# Patient Record
Sex: Male | Born: 1940 | Race: Black or African American | Hispanic: No | Smoking: Former smoker
Health system: Southern US, Community
[De-identification: ages and names within clinical notes are randomized; demographics above are authoritative.]

## PROBLEM LIST (undated history)

## (undated) DIAGNOSIS — M810 Age-related osteoporosis without current pathological fracture: Secondary | ICD-10-CM

## (undated) DIAGNOSIS — F419 Anxiety disorder, unspecified: Secondary | ICD-10-CM

## (undated) DIAGNOSIS — G4733 Obstructive sleep apnea (adult) (pediatric): Secondary | ICD-10-CM

## (undated) DIAGNOSIS — R002 Palpitations: Secondary | ICD-10-CM

## (undated) DIAGNOSIS — I10 Essential (primary) hypertension: Secondary | ICD-10-CM

## (undated) DIAGNOSIS — E213 Hyperparathyroidism, unspecified: Secondary | ICD-10-CM

## (undated) DIAGNOSIS — E119 Type 2 diabetes mellitus without complications: Secondary | ICD-10-CM

## (undated) HISTORY — PX: HERNIA REPAIR: SHX51

## (undated) HISTORY — DX: Age-related osteoporosis without current pathological fracture: M81.0

## (undated) HISTORY — DX: Obstructive sleep apnea (adult) (pediatric): G47.33

## (undated) HISTORY — DX: Essential (primary) hypertension: I10

## (undated) HISTORY — PX: BACK SURGERY: SHX140

## (undated) HISTORY — DX: Type 2 diabetes mellitus without complications: E11.9

---

## 2012-08-26 ENCOUNTER — Other Ambulatory Visit (HOSPITAL_COMMUNITY): Payer: Self-pay | Admitting: Nephrology

## 2012-09-03 ENCOUNTER — Encounter (HOSPITAL_COMMUNITY)
Admission: RE | Admit: 2012-09-03 | Discharge: 2012-09-03 | Disposition: A | Discharge status: Home / Self Care | Payer: Medicare Other | Source: Ambulatory Visit | Attending: Nephrology | Admitting: Nephrology

## 2012-09-03 MED ORDER — TECHNETIUM TC 99M SESTAMIBI GENERIC - CARDIOLITE
25.0000 | Freq: Once | INTRAVENOUS | Status: AC | PRN
  Administered 2012-09-03: 25 via INTRAVENOUS

## 2012-09-10 ENCOUNTER — Encounter (INDEPENDENT_AMBULATORY_CARE_PROVIDER_SITE_OTHER): Payer: Self-pay

## 2012-10-02 ENCOUNTER — Encounter (INDEPENDENT_AMBULATORY_CARE_PROVIDER_SITE_OTHER): Payer: Self-pay | Admitting: Surgery

## 2012-10-03 ENCOUNTER — Other Ambulatory Visit (INDEPENDENT_AMBULATORY_CARE_PROVIDER_SITE_OTHER): Payer: Self-pay

## 2012-10-03 ENCOUNTER — Encounter (INDEPENDENT_AMBULATORY_CARE_PROVIDER_SITE_OTHER): Payer: Self-pay | Admitting: Surgery

## 2012-10-03 ENCOUNTER — Ambulatory Visit (INDEPENDENT_AMBULATORY_CARE_PROVIDER_SITE_OTHER): Payer: Medicare Other | Admitting: Surgery

## 2012-10-03 VITALS — BP 142/80 | HR 96 | Temp 97.9°F | Resp 20 | Ht 70.5 in | Wt 274.6 lb

## 2012-10-03 DIAGNOSIS — E210 Primary hyperparathyroidism: Secondary | ICD-10-CM | POA: Insufficient documentation

## 2012-10-03 NOTE — Progress Notes (Signed)
General Surgery [facility]  Chief Complaint  Patient presents with  . 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 Zetta Bills    HISTORY: Patient is a 71 year old black male referred by his nephrologist for evaluation of suspected primary hyperparathyroidism. Patient has chronic renal insufficiency and is followed closely by his nephrologist. He was noted on routine laboratory studies to have an elevated calcium level of 11.2. Vitamin D levels were normal.  Intact PTH level was elevated at 114. Patient subsequently underwent a nuclear medicine parathyroid scan which appears to localize a parathyroid adenoma to the left inferior position in the upper mediastinum. CT scan of the neck and upper mediastinum has been recommended by radiology for further localization.  Patient has no prior history of head or neck surgery. He has no history of thyroid or parathyroid disease. He has never been on thyroid medication. He has had no complications from his hyperparathyroidism and denies nephrolithiasis, fractures, osteoporosis, or chronic fatigue.  Past Medical History  Diagnosis Date  . Hypertension   . Chronic kidney disease   . Diabetes mellitus without complication   . Sleep apnea, obstructive   . Osteoporosis      Current Outpatient Prescriptions  Medication Sig Dispense Refill  . amLODipine-valsartan (EXFORGE) 10-320 MG per tablet Take 1 tablet by mouth daily.      Marland Kitchen aspirin (ASPIRIN EC) 81 MG EC tablet Take 81 mg by mouth daily. Swallow whole.      . chlorthalidone (HYGROTON) 25 MG tablet Take 25 mg by mouth daily.      . Insulin Aspart (NOVOLOG FLEXPEN [place]) Inject into the skin.      Marland Kitchen insulin glargine (LANTUS) 100 UNIT/ML injection Inject 45 Units into the skin 2 (two) times daily.      . Multiple Vitamins-Minerals (MULTIVITAMIN WITH MINERALS) tablet Take 1 tablet by mouth daily.      . simvastatin (ZOCOR) 40 MG tablet Take 40 mg by mouth  every evening.      . terazosin (HYTRIN) 10 MG capsule Take 20 mg by mouth at bedtime.         No Known Allergies   Family History  Problem Relation Age of Onset  . Diabetes Mother   . Heart disease Mother   . Diabetes Father   . Heart disease Father      History   Social History  . Marital Status: Legally Separated    Spouse Name: N/A    Number of Children: N/A  . Years of Education: N/A   Social History Main Topics  . Smoking status: Former Games developer  . Smokeless tobacco: Former Neurosurgeon    Quit date: 11/13/1981  . Alcohol Use: No  . Drug Use: No  . Sexually Active: None   Other Topics Concern  . None   Social History Narrative  . None     REVIEW OF SYSTEMS - PERTINENT POSITIVES ONLY: Denies fatigue. Denies tremor. Denies palpitations. Denies nephrolithiasis. Denies bone or joint disease.  EXAM: Filed Vitals:   10/03/12 1417  BP: 142/80  Pulse: 96  Temp: 97.9 F (36.6 C)  Resp: 20    HEENT: normocephalic; pupils equal and reactive; sclerae clear; dentition fair; mucous membranes moist NECK:  symmetric on extension; no palpable anterior or posterior cervical lymphadenopathy; no supraclavicular masses; no tenderness CHEST: clear to auscultation bilaterally without rales, rhonchi, or wheezes CARDIAC: regular rate and rhythm without significant murmur; peripheral pulses are full EXT:  non-tender  with moderate edema; no deformity NEURO: no gross focal deficits; no sign of tremor   LABORATORY RESULTS: See [facility] (CHL-Epic) for most recent results   RADIOLOGY RESULTS: See [facility] (CHL-Epic) for most recent results   IMPRESSION: #1 Primary hyperparathyroidism, 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 We reviewed the laboratory studies provided by his nephrologist. We  reviewed the results of the radiographic studies. I provided him with written literature on parathyroid disease to review.  We will obtain a CT scan of the neck and upper mediastinum with contrast to better localize a possible parathyroid adenoma in the left inferior position. I will contact the patient with these results.  If the CT scan shows a mass consistent with parathyroid adenoma, then I think the patient will be an excellent candidate for minimally invasive surgery. This can be performed on an outpatient basis. We will discuss this once his CT scan results are available.  The risks and benefits of the procedure have been discussed at length with the patient.  The patient understands the proposed procedure, potential alternative treatments, and the course of recovery to be expected.  All of the patient's questions have been answered at this time.  The patient wishes to proceed with surgical evaluation.  Velora Heckler, MD, FACS General & Endocrine Surgery [facility]   Visit Diagnoses: 1. Hyperparathyroidism, primary     Primary Care Physician: Jolene Provost, MD

## 2012-10-03 NOTE — Patient Instructions (Signed)
Parathyroidectomy A parathyroidectomy is surgery to remove one or more parathyroid glands. These glands produce a hormone (parathyroid hormone) that helps control the level of calcium in your body. The glands are very small, about the size of a pea. They are located in your neck, close to your thyroid gland and your Adam's apple. Most people (85%) have four parathyroid glands,some people may have one or two more than that. Hyperparathyroidism is when too much parathyroid hormone is being produced. Usually this is caused by one of the parathyroid glands becoming enlarged, but it can also be caused by more than one of the glands. Hyperparathyroidism is found during blood tests that show high calcium in the blood. Parathyroid hormone levels will also be elevated. Cancer also can cause hyperparathyroidism, but this is rare. For the most common type of hyperparathyroidism, the treatment is surgical removal of the parathyroid gland that is enlarged. For patients with kidney failure and hyperparathyroidism, other treatment will be tried before surgery is done on the parathyroid.  Many times x-ray studies are done to find out which parathyroid gland or glands is malfunctioning. The decision about the best treatment for hyperparathyroidism is between the patient, their primary doctor, an endocrinologist, and a surgeon experienced in parathyroid surgery. LET YOUR CAREGIVER KNOW ABOUT:  Any allergies.  All medications you are taking, including:  Herbs, eyedrops, over-the-counter medications and creams.  Blood thinners (anticoagulants), aspirin or other drugs that could affect blood clotting.  Use of steroids (by mouth or as creams).  Previous problems with anesthetics, including local anesthetics.  Possibility of pregnancy, if this applies.  Any history of blood clots.  Any history of bleeding or other blood problems.  Previous surgery.  Smoking history.  Other health problems. RISKS AND  COMPLICATIONS   Short-term possibilities include:  Excessive bleeding.  Pain.  Infection near the incision.  Slow healing.  Pooling of blood under the wound (hematoma).  Damage to nerves in your neck.  Blood clots.  Difficulty breathing. This is very rare. It also is almost always temporary.  Longer-term possibilities include:  Scarring.  Skin damage.  Damage to blood vessels in the area.  Need for additional surgery.  A hoarse or weak voice. This is usually temporary. It can be the result of nerve damage.  Development of hypoparathyroidism. This means you are not making enough parathyroid hormone. It is rare. If it occurs, you will need to take calcium supplements daily. BEFORE THE PROCEDURE  Sometimes the surgery is done on an outpatient basis. This means you could go home the same day as your surgery. Other times, people need to stay in the hospital overnight. Ask your surgeon what you should expect.  If your surgery will be an outpatient procedure, arrange for someone to drive you home after the surgery.  Two weeks before your surgery, stop using aspirin and non-steroidal anti-inflammatory drugs (NSAID's) for pain relief. This includes prescription drugs and over-the-counter drugs such as ibuprofen and naproxen. Also stop taking vitamin E.  If you take blood-thinners, ask your healthcare provider when you should stop taking them.  Do not eat or drink for about 8 hours before your surgery.  You might be asked to shower or wash with a special antibacterial soap before the procedure.  Arrive at least an hour before the surgery, or whenever your surgeon recommends. This will give you time to check in and fill out any needed paperwork. PROCEDURE  The preparation:  You will change into a hospital gown.  You   will be given an IV. A needle will be inserted in your arm. Medication will be able to flow directly into your body through this needle.  You might be given a  sedative to help you relax.  You will be given a drug that puts you to sleep during the surgery (general anesthetic).  The procedure:  Once you are asleep, the surgeon will make a small cut (incision) in your lower neck. Ask your surgeon where the incision will be.  The surgeon will look for the gland(s) that are not working well. Often a tissue sample from a gland is used to determine this.  Any glands that are not working well will be removed.  The surgeon will close the incision with stitches, often these are hidden under the skin. AFTER THE PROCEDURE  You will stay in a recovery area until the anesthesia has worn off. Your blood pressure and heart rate will be checked.  If your surgery was an outpatient procedure, you will go home the same day.  If you need to stay in the hospital, you will be moved to a hospital room. You will probably stay for two to three days. This will depend on how quickly you recover.  While you are in the hospital, your blood will be tested to check the calcium levels in your body. HOME CARE INSTRUCTIONS   Take any medication that your surgeon prescribes. Follow the directions carefully. Take all of the medication.  Ask your surgeon whether you can take over-the-counter medicines for pain, discomfort or fever. Do not take aspirin unless your healthcare provider says to. Aspirin increases the chances of bleeding.  Do not get the wound wet for the first few days after surgery (or until the surgeon tells you it is OK). SEEK MEDICAL CARE IF:   You notice blood or fluid leaking from the wound, or it becomes red or swollen.  You have trouble breathing.  You have trouble speaking.  You become nauseous or throw up for more than two days after the surgery.  You develop a fever of more than 100.5 F (38.1 C). SEEK IMMEDIATE MEDICAL CARE IF:   Breathing becomes more difficult.  You develop a fever of 102.0 F (38.9 C) or higher. Document Released:  01/26/2009 Document Revised: 01/22/2012 Document Reviewed: 01/26/2009 ExitCare Patient Information 2013 [employer].  

## 2012-10-08 ENCOUNTER — Ambulatory Visit
Admission: RE | Admit: 2012-10-08 | Discharge: 2012-10-08 | Disposition: A | Discharge status: Home / Self Care | Payer: Medicare Other | Source: Ambulatory Visit | Attending: Surgery | Admitting: Surgery

## 2012-10-08 DIAGNOSIS — E210 Primary hyperparathyroidism: Secondary | ICD-10-CM

## 2012-10-08 MED ORDER — IOHEXOL 300 MG/ML  SOLN
75.0000 mL | Freq: Once | INTRAMUSCULAR | Status: AC | PRN
  Administered 2012-10-08: 75 mL via INTRAVENOUS

## 2012-10-09 ENCOUNTER — Telehealth (INDEPENDENT_AMBULATORY_CARE_PROVIDER_SITE_OTHER): Payer: Self-pay | Admitting: Surgery

## 2012-10-09 ENCOUNTER — Other Ambulatory Visit (INDEPENDENT_AMBULATORY_CARE_PROVIDER_SITE_OTHER): Payer: Self-pay | Admitting: Surgery

## 2012-10-09 DIAGNOSIS — E210 Primary hyperparathyroidism: Secondary | ICD-10-CM

## 2012-10-09 NOTE — Telephone Encounter (Signed)
General Surgery [facility]  CT scan results demonstrate two small nodular densities in the region where we would expect a parathyroid adenoma. This correlates with the result of the nuclear medicine parathyroid scan.  I contacted the patient and left a message that I recommend proceeding with minimally invasive parathyroid surgery. I believe we can do this as an outpatient.  Velora Heckler, MD, [facility] Office: [phone]

## 2012-11-12 ENCOUNTER — Encounter (HOSPITAL_COMMUNITY): Payer: Self-pay | Admitting: Pharmacy Technician

## 2012-11-14 ENCOUNTER — Encounter (HOSPITAL_COMMUNITY): Payer: Self-pay

## 2012-11-14 ENCOUNTER — Encounter (HOSPITAL_COMMUNITY)
Admission: RE | Admit: 2012-11-14 | Discharge: 2012-11-14 | Disposition: A | Discharge status: Home / Self Care | Payer: Medicare Other | Source: Ambulatory Visit | Attending: Surgery | Admitting: Surgery

## 2012-11-14 ENCOUNTER — Ambulatory Visit (HOSPITAL_COMMUNITY)
Admission: RE | Admit: 2012-11-14 | Discharge: 2012-11-14 | Disposition: A | Discharge status: Home / Self Care | Payer: Medicare Other | Source: Ambulatory Visit | Attending: Surgery | Admitting: Surgery

## 2012-11-14 DIAGNOSIS — Z01818 Encounter for other preprocedural examination: Secondary | ICD-10-CM | POA: Insufficient documentation

## 2012-11-14 HISTORY — DX: Anxiety disorder, unspecified: F41.9

## 2012-11-14 HISTORY — DX: Hyperparathyroidism, unspecified: E21.3

## 2012-11-14 LAB — CBC
HCT: 35.2 % — ABNORMAL LOW (ref 39.0–52.0)
MCH: 28.4 pg (ref 26.0–34.0)
MCV: 86.9 fL (ref 78.0–100.0)
Platelets: 222 10*3/uL (ref 150–400)
RDW: 14.1 % (ref 11.5–15.5)

## 2012-11-14 LAB — BASIC METABOLIC PANEL
BUN: 29 mg/dL — ABNORMAL HIGH (ref 6–23)
Calcium: 10.7 mg/dL — ABNORMAL HIGH (ref 8.4–10.5)
Creatinine, Ser: 1.44 mg/dL — ABNORMAL HIGH (ref 0.50–1.35)
GFR calc Af Amer: 55 mL/min — ABNORMAL LOW (ref >90)

## 2012-11-14 NOTE — Patient Instructions (Addendum)
[address] Xzavior ELIAZ FOUT  11/14/2012   Your procedure is scheduled on: 11/22/12  Report to [facility] at [phone].  Call this number if you have problems the morning of surgery 336-: [phone]   Remember: take half dose of insulin on the night before surgery 11/21/12 and no insulin on the morning of surgery 11/22/12   Do not eat food or drink liquids After Midnight.     Do not take any medicines the morning of surgery    Do not wear jewelry, make-up or nail polish.  Do not wear lotions, powders, or perfumes. You may wear deodorant.  Do not shave 48 hours prior to surgery. Men may shave face and neck.  Do not bring valuables to the hospital.  Contacts, dentures or bridgework may not be worn into surgery.  .   Patients discharged the day of surgery will not be allowed to drive home.  Name and phone number of your driver: Lenise Arena [phone] .   Please read over the following fact sheets that you were given: MRSA Information.  Birdie Sons, RN  pre op nurse call if needed [phone]    FAILURE TO FOLLOW THESE INSTRUCTIONS MAY RESULT IN CANCELLATION OF YOUR SURGERY   Patient Signature: ___________________________________________

## 2012-11-14 NOTE — Progress Notes (Signed)
Quick Note:  These results are acceptable for scheduled surgery.  Aspin Palomarez M. Gage Weant, MD, FACS [facility] Office: [phone]   ______ 

## 2012-11-22 ENCOUNTER — Encounter (HOSPITAL_COMMUNITY): Payer: Self-pay | Admitting: Anesthesiology

## 2012-11-22 ENCOUNTER — Ambulatory Visit (HOSPITAL_COMMUNITY)
Admission: RE | Admit: 2012-11-22 | Discharge: 2012-11-22 | Disposition: A | Discharge status: Home / Self Care | Payer: Medicare Other | Source: Ambulatory Visit | Attending: Surgery | Admitting: Surgery

## 2012-11-22 ENCOUNTER — Encounter (HOSPITAL_COMMUNITY)
Admission: RE | Disposition: A | Discharge status: Home / Self Care | Payer: Self-pay | Source: Ambulatory Visit | Attending: Surgery

## 2012-11-22 ENCOUNTER — Encounter (HOSPITAL_COMMUNITY): Payer: Self-pay | Admitting: Surgery

## 2012-11-22 ENCOUNTER — Ambulatory Visit (HOSPITAL_COMMUNITY): Payer: Medicare Other | Admitting: Anesthesiology

## 2012-11-22 ENCOUNTER — Encounter (HOSPITAL_COMMUNITY): Payer: Self-pay | Admitting: *Deleted

## 2012-11-22 DIAGNOSIS — E210 Primary hyperparathyroidism: Secondary | ICD-10-CM

## 2012-11-22 DIAGNOSIS — I129 Hypertensive chronic kidney disease with stage 1 through stage 4 chronic kidney disease, or unspecified chronic kidney disease: Secondary | ICD-10-CM | POA: Insufficient documentation

## 2012-11-22 DIAGNOSIS — Z79899 Other long term (current) drug therapy: Secondary | ICD-10-CM | POA: Insufficient documentation

## 2012-11-22 DIAGNOSIS — M810 Age-related osteoporosis without current pathological fracture: Secondary | ICD-10-CM | POA: Insufficient documentation

## 2012-11-22 DIAGNOSIS — Z7982 Long term (current) use of aspirin: Secondary | ICD-10-CM | POA: Insufficient documentation

## 2012-11-22 DIAGNOSIS — E119 Type 2 diabetes mellitus without complications: Secondary | ICD-10-CM | POA: Insufficient documentation

## 2012-11-22 DIAGNOSIS — G4733 Obstructive sleep apnea (adult) (pediatric): Secondary | ICD-10-CM | POA: Insufficient documentation

## 2012-11-22 DIAGNOSIS — N189 Chronic kidney disease, unspecified: Secondary | ICD-10-CM | POA: Insufficient documentation

## 2012-11-22 DIAGNOSIS — D351 Benign neoplasm of parathyroid gland: Secondary | ICD-10-CM

## 2012-11-22 DIAGNOSIS — Z794 Long term (current) use of insulin: Secondary | ICD-10-CM | POA: Insufficient documentation

## 2012-11-22 HISTORY — PX: PARATHYROIDECTOMY: SHX19

## 2012-11-22 LAB — GLUCOSE, CAPILLARY: Glucose-Capillary: 174 mg/dL — ABNORMAL HIGH (ref 70–99)

## 2012-11-22 SURGERY — PARATHYROIDECTOMY
Anesthesia: General | Site: Neck | Wound class: Clean

## 2012-11-22 MED ORDER — FENTANYL CITRATE 0.05 MG/ML IJ SOLN
INTRAMUSCULAR | Status: DC | PRN
  Administered 2012-11-22: 50 ug via INTRAVENOUS
  Administered 2012-11-22: 100 ug via INTRAVENOUS

## 2012-11-22 MED ORDER — ACETAMINOPHEN 10 MG/ML IV SOLN
INTRAVENOUS | Status: DC | PRN
  Administered 2012-11-22: 1000 mg via INTRAVENOUS

## 2012-11-22 MED ORDER — HYDROMORPHONE HCL PF 1 MG/ML IJ SOLN: 0.2500 mg | INTRAMUSCULAR | Status: DC | PRN

## 2012-11-22 MED ORDER — GLYCOPYRROLATE 0.2 MG/ML IJ SOLN
INTRAMUSCULAR | Status: DC | PRN
  Administered 2012-11-22: .8 mg via INTRAVENOUS

## 2012-11-22 MED ORDER — PHENYLEPHRINE HCL 10 MG/ML IJ SOLN
INTRAMUSCULAR | Status: DC | PRN
  Administered 2012-11-22 (×2): 40 ug via INTRAVENOUS

## 2012-11-22 MED ORDER — CEFAZOLIN SODIUM-DEXTROSE 2-3 GM-% IV SOLR
INTRAVENOUS | Status: AC
  Filled 2012-11-22: qty 50

## 2012-11-22 MED ORDER — NEOSTIGMINE METHYLSULFATE 1 MG/ML IJ SOLN
INTRAMUSCULAR | Status: DC | PRN
  Administered 2012-11-22: 5 mg via INTRAVENOUS

## 2012-11-22 MED ORDER — LIDOCAINE HCL (CARDIAC) 20 MG/ML IV SOLN
INTRAVENOUS | Status: DC | PRN
  Administered 2012-11-22: 50 mg via INTRAVENOUS

## 2012-11-22 MED ORDER — CEFAZOLIN SODIUM 1-5 GM-% IV SOLN
INTRAVENOUS | Status: AC
  Filled 2012-11-22: qty 50

## 2012-11-22 MED ORDER — SUCCINYLCHOLINE CHLORIDE 20 MG/ML IJ SOLN
INTRAMUSCULAR | Status: DC | PRN
  Administered 2012-11-22: 100 mg via INTRAVENOUS

## 2012-11-22 MED ORDER — PROPOFOL 10 MG/ML IV BOLUS
INTRAVENOUS | Status: DC | PRN
  Administered 2012-11-22: 200 mg via INTRAVENOUS

## 2012-11-22 MED ORDER — PROMETHAZINE HCL 25 MG/ML IJ SOLN: 6.2500 mg | INTRAMUSCULAR | Status: DC | PRN

## 2012-11-22 MED ORDER — LACTATED RINGERS IV SOLN: INTRAVENOUS | Status: DC

## 2012-11-22 MED ORDER — LACTATED RINGERS IV SOLN
INTRAVENOUS | Status: DC | PRN
  Administered 2012-11-22 (×2): via INTRAVENOUS

## 2012-11-22 MED ORDER — LIDOCAINE HCL 4 % MT SOLN
OROMUCOSAL | Status: DC | PRN
  Administered 2012-11-22: 4 mL via TOPICAL

## 2012-11-22 MED ORDER — HYDROCODONE-ACETAMINOPHEN 5-325 MG PO TABS: 1.0000 | ORAL_TABLET | ORAL | Status: DC | PRN

## 2012-11-22 MED ORDER — ROCURONIUM BROMIDE 100 MG/10ML IV SOLN
INTRAVENOUS | Status: DC | PRN
  Administered 2012-11-22: 40 mg via INTRAVENOUS

## 2012-11-22 MED ORDER — ONDANSETRON HCL 4 MG/2ML IJ SOLN
INTRAMUSCULAR | Status: DC | PRN
  Administered 2012-11-22: 4 mg via INTRAVENOUS

## 2012-11-22 MED ORDER — EPHEDRINE SULFATE 50 MG/ML IJ SOLN
INTRAMUSCULAR | Status: DC | PRN
  Administered 2012-11-22: 10 mg via INTRAVENOUS

## 2012-11-22 MED ORDER — DEXTROSE 5 % IV SOLN
3.0000 g | INTRAVENOUS | Status: AC
  Administered 2012-11-22: 3 g via INTRAVENOUS
  Filled 2012-11-22: qty 3000

## 2012-11-22 MED ORDER — ACETAMINOPHEN 10 MG/ML IV SOLN
INTRAVENOUS | Status: AC
  Filled 2012-11-22: qty 100

## 2012-11-22 MED ORDER — BUPIVACAINE HCL (PF) 0.25 % IJ SOLN
INTRAMUSCULAR | Status: AC
  Filled 2012-11-22: qty 30

## 2012-11-22 MED ORDER — AMLODIPINE BESYLATE 10 MG PO TABS
10.0000 mg | ORAL_TABLET | Freq: Once | ORAL | Status: AC
  Administered 2012-11-22: 10 mg via ORAL
  Filled 2012-11-22: qty 1

## 2012-11-22 MED ORDER — BUPIVACAINE HCL 0.25 % IJ SOLN
INTRAMUSCULAR | Status: DC | PRN
  Administered 2012-11-22: 10 mL

## 2012-11-22 SURGICAL SUPPLY — 41 items
ATTRACTOMAT 16X20 MAGNETIC DRP (DRAPES) ×2 IMPLANT
BENZOIN TINCTURE PRP APPL 2/3 (GAUZE/BANDAGES/DRESSINGS) ×2 IMPLANT
BLADE HEX COATED 2.75 (ELECTRODE) ×2 IMPLANT
BLADE SURG 15 STRL LF DISP TIS (BLADE) ×1 IMPLANT
BLADE SURG 15 STRL SS (BLADE) ×1
BLADE SURG ROTATE 9660 (MISCELLANEOUS) ×2 IMPLANT
CANISTER SUCTION 2500CC (MISCELLANEOUS) ×2 IMPLANT
CHLORAPREP W/TINT 10.5 ML (MISCELLANEOUS) ×2 IMPLANT
CLIP TI MEDIUM 6 (CLIP) ×2 IMPLANT
CLIP TI WIDE RED SMALL 6 (CLIP) ×2 IMPLANT
CLOTH BEACON ORANGE TIMEOUT ST (SAFETY) ×2 IMPLANT
CLSR STERI-STRIP ANTIMIC 1/2X4 (GAUZE/BANDAGES/DRESSINGS) ×2 IMPLANT
DISSECTOR ROUND CHERRY 3/8 STR (MISCELLANEOUS) ×2 IMPLANT
DRAPE PED LAPAROTOMY (DRAPES) ×2 IMPLANT
DRESSING SURGICEL FIBRLLR 1X2 (HEMOSTASIS) ×2 IMPLANT
DRSG SURGICEL FIBRILLAR 1X2 (HEMOSTASIS) ×4
ELECT REM PT RETURN 9FT ADLT (ELECTROSURGICAL) ×2
ELECTRODE REM PT RTRN 9FT ADLT (ELECTROSURGICAL) ×1 IMPLANT
GAUZE SPONGE 4X4 16PLY XRAY LF (GAUZE/BANDAGES/DRESSINGS) ×2 IMPLANT
GLOVE SURG ORTHO 8.0 STRL STRW (GLOVE) ×2 IMPLANT
GOWN STRL NON-REIN LRG LVL3 (GOWN DISPOSABLE) ×2 IMPLANT
GOWN STRL REIN XL XLG (GOWN DISPOSABLE) ×4 IMPLANT
KIT BASIN OR (CUSTOM PROCEDURE TRAY) ×2 IMPLANT
NEEDLE HYPO 25X1 1.5 SAFETY (NEEDLE) ×2 IMPLANT
NS IRRIG 1000ML POUR BTL (IV SOLUTION) ×2 IMPLANT
PACK BASIC VI WITH GOWN DISP (CUSTOM PROCEDURE TRAY) ×2 IMPLANT
PENCIL BUTTON HOLSTER BLD 10FT (ELECTRODE) ×2 IMPLANT
SPONGE GAUZE 4X4 12PLY (GAUZE/BANDAGES/DRESSINGS) IMPLANT
STAPLER VISISTAT 35W (STAPLE) ×2 IMPLANT
STRIP CLOSURE SKIN 1/2X4 (GAUZE/BANDAGES/DRESSINGS) ×2 IMPLANT
SUT MNCRL AB 4-0 PS2 18 (SUTURE) ×2 IMPLANT
SUT SILK 2 0 (SUTURE) ×1
SUT SILK 2-0 18XBRD TIE 12 (SUTURE) ×1 IMPLANT
SUT SILK 3 0 (SUTURE)
SUT SILK 3-0 18XBRD TIE 12 (SUTURE) IMPLANT
SUT VIC AB 3-0 SH 18 (SUTURE) ×2 IMPLANT
SYR BULB IRRIGATION 50ML (SYRINGE) ×2 IMPLANT
SYR CONTROL 10ML LL (SYRINGE) ×2 IMPLANT
TAPE CLOTH SURG 4X10 WHT LF (GAUZE/BANDAGES/DRESSINGS) ×2 IMPLANT
TOWEL OR 17X26 10 PK STRL BLUE (TOWEL DISPOSABLE) ×2 IMPLANT
YANKAUER SUCT BULB TIP 10FT TU (MISCELLANEOUS) ×2 IMPLANT

## 2012-11-22 NOTE — Anesthesia Postprocedure Evaluation (Signed)
Anesthesia Post Note  Patient: [name] Wiley  Procedure(s) Performed: Procedure(s) (LRB): PARATHYROIDECTOMY (N/A)  Anesthesia type: General  Patient location: PACU  Post pain: Pain level controlled  Post assessment: Post-op Vital signs reviewed  Last Vitals:  Filed Vitals:   11/22/12 0845  BP: 140/90  Pulse: 93  Temp:   Resp: 9    Post vital signs: Reviewed  Level of consciousness: sedated  Complications: No apparent anesthesia complications

## 2012-11-22 NOTE — Transfer of Care (Signed)
Immediate Anesthesia Transfer of Care Note  Patient: [name] Wiley  Procedure(s) Performed: Procedure(s) (LRB) with comments: PARATHYROIDECTOMY (N/A) - Parathyroidectomy  Patient Location: PACU  Anesthesia Type:General  Level of Consciousness: awake and alert   Airway & Oxygen Therapy: Patient Spontanous Breathing and Patient connected to face mask oxygen  Post-op Assessment: Report given to PACU RN and Post -op Vital signs reviewed and stable  Post vital signs: Reviewed and stable  Complications: No apparent anesthesia complications

## 2012-11-22 NOTE — Progress Notes (Signed)
Pt drinking and swallowing without difficulty.

## 2012-11-22 NOTE — Anesthesia Preprocedure Evaluation (Addendum)
Anesthesia Evaluation  Patient identified by MRN, date of birth, ID band Patient awake    Reviewed: Allergy & Precautions, H&P , NPO status , Patient's Chart, lab work & pertinent test results  Airway Mallampati: II TM Distance: >3 FB Neck ROM: Full    Dental  (+) Edentulous Upper and Edentulous Lower   Pulmonary sleep apnea , former smoker,  breath sounds clear to auscultation  Pulmonary exam normal       Cardiovascular hypertension, Pt. on medications Rhythm:Regular Rate:Normal     Neuro/Psych Anxiety negative neurological ROS     GI/Hepatic negative GI ROS, Neg liver ROS,   Endo/Other  diabetes, Poorly Controlled, Type 2, Insulin DependentMorbid obesity  Renal/GU negative Renal ROS  negative genitourinary   Musculoskeletal negative musculoskeletal ROS (+)   Abdominal (+) + obese,   Peds  Hematology negative hematology ROS (+)   Anesthesia Other Findings   Reproductive/Obstetrics negative OB ROS                          Anesthesia Physical Anesthesia Plan  ASA: III  Anesthesia Plan: General   Post-op Pain Management:    Induction: Intravenous  Airway Management Planned: Oral ETT  Additional Equipment:   Intra-op Plan:   Post-operative Plan: Extubation in OR  Informed Consent: I have reviewed the patients History and Physical, chart, labs and discussed the procedure including the risks, benefits and alternatives for the proposed anesthesia with the patient or authorized representative who has indicated his/her understanding and acceptance.   Dental advisory given  Plan Discussed with: CRNA  Anesthesia Plan Comments:         Anesthesia Quick Evaluation

## 2012-11-22 NOTE — H&P (Signed)
[name] Wiley is an 72 y.o. male.    Chief Complaint: primary hyperparathyroidism, left inferior parathyroid adenoma  HPI: Patient is a 72 year old black male referred by his nephrologist for evaluation of suspected primary hyperparathyroidism. Patient has chronic renal insufficiency and is followed closely by his nephrologist. He was noted on routine laboratory studies to have an elevated calcium level of 11.2. Vitamin D levels were normal. Intact PTH level was elevated at 114. Patient subsequently underwent a nuclear medicine parathyroid scan which appears to localize a parathyroid adenoma to the left inferior position in the upper mediastinum. CT scan of the neck and upper mediastinum has confirmed a mass at that location.   Patient has no prior history of head or neck surgery. He has no history of thyroid or parathyroid disease. He has never been on thyroid medication. He has had no complications from his hyperparathyroidism and denies nephrolithiasis, fractures, osteoporosis, or chronic fatigue.   Past Medical History  Diagnosis Date  . Hypertension   . Diabetes mellitus without complication   . Sleep apnea, obstructive   . Osteoporosis   . Hyperparathyroidism   . Anxiety     Past Surgical History  Procedure Date  . Hernia repair     Lt ing hernia  . Back surgery     lower back    Family History  Problem Relation Age of Onset  . Diabetes Mother   . Heart disease Mother   . Diabetes Father   . Heart disease Father    Social History:  reports that he quit smoking about 40 years ago. His smoking use included Cigarettes. He has a 20 pack-year smoking history. He has never used smokeless tobacco. He reports that he does not drink alcohol or use illicit drugs.  Allergies: No Known Allergies  Medications Prior to Admission  Medication Sig Dispense Refill  . amLODipine-valsartan (EXFORGE) 10-320 MG per tablet Take 1 tablet by mouth daily before breakfast.       . aspirin EC  81 MG tablet Take 162 mg by mouth daily.      . chlorthalidone (HYGROTON) 25 MG tablet Take 25 mg by mouth daily before breakfast.       . furosemide (LASIX) 40 MG tablet Take 40 mg by mouth daily before breakfast.      . insulin aspart (NOVOLOG) 100 UNIT/ML injection Inject 11-38 Units into the skin 3 (three) times daily before meals. Morning 70-90=16 units lunch 70-90=11units dinner 70-90=13 units if greater than 450 morning 42 units lunch 34 units dinner 38 units      . insulin glargine (LANTUS) 100 UNIT/ML injection Inject 45 Units into the skin 2 (two) times daily.      Marland Kitchen L-Methylfolate-B6-B12 (METANX PO) Take 40 mg by mouth 2 (two) times daily.      . Multiple Vitamin (MULTIVITAMIN) LIQD Take 5 mLs by mouth daily.      . simvastatin (ZOCOR) 40 MG tablet Take 40 mg by mouth at bedtime.       Marland Kitchen terazosin (HYTRIN) 10 MG capsule Take 20 mg by mouth at bedtime.        Results for orders placed during the hospital encounter of 11/22/12 (from the past 48 hour(s))  GLUCOSE, CAPILLARY     Status: Abnormal   Collection Time   11/22/12  6:21 AM      Component Value Range Comment   Glucose-Capillary 174 (*) 70 - 99 mg/dL    No results found.  Review of Systems  Constitutional: Negative.   HENT: Negative.   Eyes: Negative.   Respiratory: Negative.   Cardiovascular: Negative.   Gastrointestinal: Negative.   Genitourinary: Negative.   Musculoskeletal: Negative.   Skin: Negative.   Neurological: Negative.   Endo/Heme/Allergies: Negative.   Psychiatric/Behavioral: Negative.     Blood pressure 142/84, pulse 103, temperature 98 F (36.7 C), temperature source Oral, resp. rate 16, SpO2 98.00%. Physical Exam  Constitutional: He is oriented to person, place, and time. He appears well-developed and well-nourished. No distress.  HENT:  Head: Normocephalic and atraumatic.  Right Ear: External ear normal.  Left Ear: External ear normal.  Mouth/Throat: Oropharynx is clear and moist.  Eyes:  Conjunctivae normal are normal. Pupils are equal, round, and reactive to light. No scleral icterus.  Neck: Normal range of motion. Neck supple. No tracheal deviation present. No thyromegaly present.  Cardiovascular: Normal rate, regular rhythm and normal heart sounds.   Respiratory: Effort normal and breath sounds normal.  GI: Soft. Bowel sounds are normal. He exhibits no distension.  Musculoskeletal: Normal range of motion. He exhibits no edema.  Lymphadenopathy:    He has no cervical adenopathy.  Neurological: He is alert and oriented to person, place, and time.  Skin: Skin is warm and dry.  Psychiatric: He has a normal mood and affect. His behavior is normal.     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  The risks and benefits of the procedure have been discussed at length with the patient.  The patient understands the proposed procedure, potential alternative treatments, and the course of recovery to be expected.  All of the patient's questions have been answered at this time.  The patient wishes to proceed with surgery.  Velora Heckler, MD, FACS General & Endocrine Surgery [facility] Office: [phone]   [name] Wiley Judie Petit 11/22/2012, 7:11 AM

## 2012-11-22 NOTE — Op Note (Signed)
OPERATIVE REPORT - PARATHYROIDECTOMY  Preoperative diagnosis: Primary hyperparathyroidism  Postop diagnosis: Same  Procedure: Left inferior minimally invasive parathyroidectomy  Surgeon:  Velora Heckler, MD, FACS  Anesthesia: Gen. endotracheal  Estimated blood loss: Minimal  Preparation: ChloraPrep  Indications: Patient is a 72 yo BM referred by his nephrologist with hypercalcemia and elevated PTH levels.  Sestamibi scan localizes an adenoma to the left inferior position.  This is confirmed by CT scan.  Patient now comes to surgery for minimally invasive parathyroidectomy.  Procedure: Patient was prepared in the holding area. He was brought to operating room and placed in a supine position on the operating room table. Following administration of general anesthesia, the patient was positioned and then prepped and draped in the usual strict aseptic fashion. After ascertaining that an adequate level of anesthesia been achieved, a neck incision is made with a #15 blade. Dissection was carried through subcutaneous tissues and platysma. Hemostasis was obtained with the electrocautery. Skin flaps were developed circumferentially and a Weitlander retractor was placed for exposure.  Strap muscles were incised in the midline. Strap muscles were reflected exposing the thyroid lobe. With gentle blunt dissection the thyroid lobe was mobilized.  Dissection was carried through adipose tissue and an enlarged parathyroid gland is identified. It is gently mobilized. Vascular structures are divided between small and medium ligaclips. Care is taken to avoid the recurrent laryngeal nerve and the esophagus. Gland is completely excised. It is submitted to pathology. Frozen section confirms parathyroid tissue consistent with adenoma.  Neck is irrigated with warm saline. Good hemostasis is noted. Fibrillar is placed in the operative field. Strap muscles are reapproximated in the midline with interrupted 3-0 Vicryl  sutures. Platysma was closed with interrupted 3-0 Vicryl sutures. Skin is closed with a running 4-0 Monocryl subcuticular suture. Local anesthetic is infiltrated circumferentially. Wound was washed and dried and benzoin and Steri-Strips are applied. Sterile gauze dressing is applied. Patient is awakened from anesthesia and brought to the recovery room. The patient tolerated the procedure well.   Velora Heckler, MD, FACS General & Endocrine Surgery [facility]

## 2012-11-25 ENCOUNTER — Encounter (HOSPITAL_COMMUNITY): Payer: Self-pay | Admitting: Surgery

## 2012-11-25 ENCOUNTER — Telehealth (INDEPENDENT_AMBULATORY_CARE_PROVIDER_SITE_OTHER): Payer: Self-pay

## 2012-11-25 NOTE — Telephone Encounter (Signed)
Pt home doing well. PO appt made. Lab slip mailed to pt for [facility] on 1-27.

## 2012-12-11 ENCOUNTER — Encounter (INDEPENDENT_AMBULATORY_CARE_PROVIDER_SITE_OTHER): Payer: Self-pay | Admitting: Surgery

## 2012-12-11 ENCOUNTER — Ambulatory Visit (INDEPENDENT_AMBULATORY_CARE_PROVIDER_SITE_OTHER): Payer: Medicare Other | Admitting: Surgery

## 2012-12-11 VITALS — BP 130/76 | HR 88 | Temp 97.8°F | Resp 20 | Ht 70.5 in | Wt 271.0 lb

## 2012-12-11 DIAGNOSIS — E210 Primary hyperparathyroidism: Secondary | ICD-10-CM

## 2012-12-11 NOTE — Patient Instructions (Signed)
  COCOA BUTTER & VITAMIN E CREAM  ([employer] or other brand)  Apply cocoa butter/vitamin E cream to your incision 2 - 3 times daily.  Massage cream into incision for one minute with each application.  Use sunscreen (50 SPF or higher) for first 6 months after surgery if area is exposed to sun.  You may substitute Mederma or other scar reducing creams as desired.   

## 2012-12-11 NOTE — Progress Notes (Signed)
General Surgery [facility]  Visit Diagnoses: 1. Hyperparathyroidism, primary     HISTORY: Patient is a 72 year old black male who underwent minimally invasive parathyroidectomy on 11/22/2012. 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  Follow-up serum calcium level is normal at 10.0.  EXAM: Surgical incision is well-healed. Minimal soft tissue swelling. Voice quality is normal. No sign of infection.  IMPRESSION: Status post minimally invasive parathyroidectomy  PLAN: Patient will begin applying topical creams to his incisions. He will return for follow-up in 6 weeks. We will check a serum calcium level and intact PTH level prior to that office visit.  Velora Heckler, MD, FACS General & Endocrine Surgery [facility]

## 2012-12-20 ENCOUNTER — Encounter (INDEPENDENT_AMBULATORY_CARE_PROVIDER_SITE_OTHER): Payer: Self-pay

## 2013-01-22 ENCOUNTER — Ambulatory Visit (INDEPENDENT_AMBULATORY_CARE_PROVIDER_SITE_OTHER): Payer: Medicare Other | Admitting: Surgery

## 2013-01-22 ENCOUNTER — Encounter (INDEPENDENT_AMBULATORY_CARE_PROVIDER_SITE_OTHER): Payer: Self-pay | Admitting: Surgery

## 2013-01-22 VITALS — BP 122/64 | HR 72 | Temp 97.2°F | Resp 16 | Ht 70.5 in | Wt 272.0 lb

## 2013-01-22 DIAGNOSIS — E210 Primary hyperparathyroidism: Secondary | ICD-10-CM

## 2013-01-22 NOTE — Progress Notes (Signed)
General Surgery [facility]  Visit Diagnoses: 1. Hyperparathyroidism, primary     HISTORY: Patient returns for her second postoperative visit.  He underwent minimally invasive parathyroidectomy in January 2014. Calcium level has now normalized with his most recent studies showing a calcium level of 9.7. His postoperative intact PTH level however remains elevated at 104, down from his preoperative level of 114.  Patient states that overall he feels better. He feels more alert. He feels stronger. He has no complaints related to his neck surgery.  EXAM: Surgical wound is well healed with good cosmetic result. No sign of seroma. No sign of infection. Voice quality is normal.  IMPRESSION: status post minimally invasive parathyroidectomy  PLAN: Patient and I reviewed his laboratory studies. I believe these will normalize with additional time. There is a small chance of a second gland adenoma or 4 gland hyperplasia. The risk of this is less than 4%.  We will obtain a follow-up serum calcium level and intact PTH level in 6 months. I will see the patient back in the office to review these studies.  Velora Heckler, MD, FACS General & Endocrine Surgery [facility]

## 2013-01-22 NOTE — Patient Instructions (Signed)
  COCOA BUTTER & VITAMIN E CREAM  ([employer] or other brand)  Apply cocoa butter/vitamin E cream to your incision 2 - 3 times daily.  Massage cream into incision for one minute with each application.  Use sunscreen (50 SPF or higher) for first 6 months after surgery if area is exposed to sun.  You may substitute Mederma or other scar reducing creams as desired.   

## 2013-02-24 ENCOUNTER — Encounter (INDEPENDENT_AMBULATORY_CARE_PROVIDER_SITE_OTHER): Payer: Self-pay

## 2013-09-30 ENCOUNTER — Encounter (INDEPENDENT_AMBULATORY_CARE_PROVIDER_SITE_OTHER): Payer: Self-pay

## 2014-07-29 ENCOUNTER — Emergency Department (HOSPITAL_COMMUNITY): Payer: Medicare Other

## 2014-07-29 ENCOUNTER — Inpatient Hospital Stay (HOSPITAL_COMMUNITY)
Admission: EM | Admit: 2014-07-29 | Discharge: 2014-08-01 | DRG: 251 | Disposition: A | Discharge status: Home / Self Care | Payer: Medicare Other | Attending: Interventional Cardiology | Admitting: Interventional Cardiology

## 2014-07-29 ENCOUNTER — Encounter (HOSPITAL_COMMUNITY): Payer: Self-pay | Admitting: Emergency Medicine

## 2014-07-29 DIAGNOSIS — F411 Generalized anxiety disorder: Secondary | ICD-10-CM | POA: Diagnosis present

## 2014-07-29 DIAGNOSIS — M810 Age-related osteoporosis without current pathological fracture: Secondary | ICD-10-CM | POA: Diagnosis present

## 2014-07-29 DIAGNOSIS — G4733 Obstructive sleep apnea (adult) (pediatric): Secondary | ICD-10-CM | POA: Diagnosis present

## 2014-07-29 DIAGNOSIS — I472 Ventricular tachycardia, unspecified: Principal | ICD-10-CM

## 2014-07-29 DIAGNOSIS — I129 Hypertensive chronic kidney disease with stage 1 through stage 4 chronic kidney disease, or unspecified chronic kidney disease: Secondary | ICD-10-CM | POA: Diagnosis present

## 2014-07-29 DIAGNOSIS — E1121 Type 2 diabetes mellitus with diabetic nephropathy: Secondary | ICD-10-CM

## 2014-07-29 DIAGNOSIS — R002 Palpitations: Secondary | ICD-10-CM | POA: Diagnosis not present

## 2014-07-29 DIAGNOSIS — E785 Hyperlipidemia, unspecified: Secondary | ICD-10-CM | POA: Diagnosis present

## 2014-07-29 DIAGNOSIS — Z833 Family history of diabetes mellitus: Secondary | ICD-10-CM

## 2014-07-29 DIAGNOSIS — N183 Chronic kidney disease, stage 3 (moderate): Secondary | ICD-10-CM

## 2014-07-29 DIAGNOSIS — Z8249 Family history of ischemic heart disease and other diseases of the circulatory system: Secondary | ICD-10-CM

## 2014-07-29 DIAGNOSIS — I4729 Other ventricular tachycardia: Secondary | ICD-10-CM | POA: Diagnosis not present

## 2014-07-29 DIAGNOSIS — E119 Type 2 diabetes mellitus without complications: Secondary | ICD-10-CM | POA: Diagnosis present

## 2014-07-29 DIAGNOSIS — Z794 Long term (current) use of insulin: Secondary | ICD-10-CM

## 2014-07-29 DIAGNOSIS — N189 Chronic kidney disease, unspecified: Secondary | ICD-10-CM | POA: Diagnosis present

## 2014-07-29 DIAGNOSIS — E1122 Type 2 diabetes mellitus with diabetic chronic kidney disease: Secondary | ICD-10-CM

## 2014-07-29 DIAGNOSIS — R42 Dizziness and giddiness: Secondary | ICD-10-CM

## 2014-07-29 DIAGNOSIS — Z7982 Long term (current) use of aspirin: Secondary | ICD-10-CM

## 2014-07-29 DIAGNOSIS — E213 Hyperparathyroidism, unspecified: Secondary | ICD-10-CM | POA: Diagnosis present

## 2014-07-29 DIAGNOSIS — M7989 Other specified soft tissue disorders: Secondary | ICD-10-CM

## 2014-07-29 DIAGNOSIS — E669 Obesity, unspecified: Secondary | ICD-10-CM | POA: Diagnosis present

## 2014-07-29 DIAGNOSIS — Z87891 Personal history of nicotine dependence: Secondary | ICD-10-CM

## 2014-07-29 DIAGNOSIS — N289 Disorder of kidney and ureter, unspecified: Secondary | ICD-10-CM

## 2014-07-29 DIAGNOSIS — R0601 Orthopnea: Secondary | ICD-10-CM | POA: Diagnosis present

## 2014-07-29 HISTORY — DX: Palpitations: R00.2

## 2014-07-29 LAB — CBC
HCT: 31.6 % — ABNORMAL LOW (ref 39.0–52.0)
HCT: 33.9 % — ABNORMAL LOW (ref 39.0–52.0)
Hemoglobin: 10.4 g/dL — ABNORMAL LOW (ref 13.0–17.0)
Hemoglobin: 11.2 g/dL — ABNORMAL LOW (ref 13.0–17.0)
MCH: 28.4 pg (ref 26.0–34.0)
MCH: 28.4 pg (ref 26.0–34.0)
MCHC: 32.9 g/dL (ref 30.0–36.0)
MCHC: 33 g/dL (ref 30.0–36.0)
MCV: 86.3 fL (ref 78.0–100.0)
MCV: 86 fL (ref 78.0–100.0)
PLATELETS: 268 10*3/uL (ref 150–400)
Platelets: 231 10*3/uL (ref 150–400)
RBC: 3.66 MIL/uL — ABNORMAL LOW (ref 4.22–5.81)
RBC: 3.94 MIL/uL — ABNORMAL LOW (ref 4.22–5.81)
RDW: 14 % (ref 11.5–15.5)
RDW: 13.9 % (ref 11.5–15.5)
WBC: 7.8 10*3/uL (ref 4.0–10.5)
WBC: 9.3 10*3/uL (ref 4.0–10.5)

## 2014-07-29 LAB — BASIC METABOLIC PANEL
Anion gap: 14 (ref 5–15)
BUN: 40 mg/dL — ABNORMAL HIGH (ref 6–23)
CO2: 22 mEq/L (ref 19–32)
Calcium: 9.7 mg/dL (ref 8.4–10.5)
Chloride: 100 mEq/L (ref 96–112)
Creatinine, Ser: 1.82 mg/dL — ABNORMAL HIGH (ref 0.50–1.35)
GFR calc Af Amer: 41 mL/min — ABNORMAL LOW (ref >90)
GFR calc non Af Amer: 35 mL/min — ABNORMAL LOW (ref >90)
Glucose, Bld: 220 mg/dL — ABNORMAL HIGH (ref 70–99)
Potassium: 3.6 mEq/L — ABNORMAL LOW (ref 3.7–5.3)
Sodium: 136 mEq/L — ABNORMAL LOW (ref 137–147)

## 2014-07-29 LAB — CREATININE, SERUM
Creatinine, Ser: 1.73 mg/dL — ABNORMAL HIGH (ref 0.50–1.35)
GFR calc Af Amer: 44 mL/min — ABNORMAL LOW (ref >90)
GFR calc non Af Amer: 38 mL/min — ABNORMAL LOW (ref >90)

## 2014-07-29 LAB — PRO B NATRIURETIC PEPTIDE: Pro B Natriuretic peptide (BNP): 104.8 pg/mL (ref 0–125)

## 2014-07-29 LAB — I-STAT TROPONIN, ED: Troponin i, poc: 0.01 ng/mL (ref 0.00–0.08)

## 2014-07-29 LAB — MAGNESIUM: Magnesium: 1.8 mg/dL (ref 1.5–2.5)

## 2014-07-29 LAB — CBG MONITORING, ED: Glucose-Capillary: 172 mg/dL — ABNORMAL HIGH (ref 70–99)

## 2014-07-29 LAB — GLUCOSE, CAPILLARY: Glucose-Capillary: 195 mg/dL — ABNORMAL HIGH (ref 70–99)

## 2014-07-29 LAB — TSH: TSH: 0.549 u[IU]/mL (ref 0.350–4.500)

## 2014-07-29 LAB — TROPONIN I: Troponin I: <0.3 ng/mL (ref <0.30)

## 2014-07-29 MED ORDER — ASPIRIN 81 MG PO CHEW
81.0000 mg | CHEWABLE_TABLET | ORAL | Status: AC
  Administered 2014-07-30: 81 mg via ORAL
  Filled 2014-07-29: qty 1

## 2014-07-29 MED ORDER — MECLIZINE HCL 25 MG PO TABS
25.0000 mg | ORAL_TABLET | Freq: Once | ORAL | Status: AC
  Administered 2014-07-29: 25 mg via ORAL
  Filled 2014-07-29: qty 1

## 2014-07-29 MED ORDER — SODIUM CHLORIDE 0.9 % IV BOLUS (SEPSIS)
500.0000 mL | Freq: Once | INTRAVENOUS | Status: AC
  Administered 2014-07-29: 500 mL via INTRAVENOUS

## 2014-07-29 MED ORDER — SODIUM CHLORIDE 0.9 % IV SOLN
1.0000 mL/kg/h | INTRAVENOUS | Status: DC
  Administered 2014-07-29 – 2014-07-30 (×2): 1 mL/kg/h via INTRAVENOUS

## 2014-07-29 MED ORDER — FUROSEMIDE 40 MG PO TABS
40.0000 mg | ORAL_TABLET | Freq: Two times a day (BID) | ORAL | Status: DC
  Filled 2014-07-29 (×3): qty 1

## 2014-07-29 MED ORDER — ATORVASTATIN CALCIUM 20 MG PO TABS
20.0000 mg | ORAL_TABLET | Freq: Every day | ORAL | Status: DC
  Administered 2014-07-29 – 2014-08-01 (×3): 20 mg via ORAL
  Filled 2014-07-29 (×4): qty 1

## 2014-07-29 MED ORDER — SIMVASTATIN 40 MG PO TABS
40.0000 mg | ORAL_TABLET | Freq: Every day | ORAL | Status: DC
Start: 2014-07-29 — End: 2014-07-29
  Filled 2014-07-29: qty 1

## 2014-07-29 MED ORDER — INSULIN GLARGINE 100 UNIT/ML ~~LOC~~ SOLN
48.0000 [IU] | Freq: Two times a day (BID) | SUBCUTANEOUS | Status: DC
  Administered 2014-07-29 – 2014-08-01 (×5): 48 [IU] via SUBCUTANEOUS
  Filled 2014-07-29 (×7): qty 0.48

## 2014-07-29 MED ORDER — HEPARIN SODIUM (PORCINE) 5000 UNIT/ML IJ SOLN
5000.0000 [IU] | Freq: Three times a day (TID) | INTRAMUSCULAR | Status: DC
  Administered 2014-07-29 – 2014-07-31 (×4): 5000 [IU] via SUBCUTANEOUS
  Filled 2014-07-29 (×10): qty 1

## 2014-07-29 MED ORDER — SODIUM CHLORIDE 0.9 % IV SOLN
250.0000 mL | INTRAVENOUS | Status: DC | PRN
Start: 2014-07-29 — End: 2014-07-30

## 2014-07-29 MED ORDER — ASPIRIN EC 81 MG PO TBEC
81.0000 mg | DELAYED_RELEASE_TABLET | Freq: Every day | ORAL | Status: DC
  Administered 2014-07-30 – 2014-08-01 (×3): 81 mg via ORAL
  Filled 2014-07-29 (×3): qty 1

## 2014-07-29 MED ORDER — ONDANSETRON HCL 4 MG/2ML IJ SOLN: 4.0000 mg | Freq: Four times a day (QID) | INTRAMUSCULAR | Status: DC | PRN

## 2014-07-29 MED ORDER — NITROGLYCERIN 0.4 MG SL SUBL: 0.4000 mg | SUBLINGUAL_TABLET | SUBLINGUAL | Status: DC | PRN

## 2014-07-29 MED ORDER — ACETAMINOPHEN 325 MG PO TABS: 650.0000 mg | ORAL_TABLET | ORAL | Status: DC | PRN

## 2014-07-29 MED ORDER — METOPROLOL TARTRATE 25 MG PO TABS
25.0000 mg | ORAL_TABLET | Freq: Two times a day (BID) | ORAL | Status: DC
  Administered 2014-07-29: 25 mg via ORAL
  Filled 2014-07-29 (×3): qty 1

## 2014-07-29 MED ORDER — MAGNESIUM SULFATE 40 MG/ML IJ SOLN
2.0000 g | Freq: Once | INTRAMUSCULAR | Status: AC
  Administered 2014-07-29: 2 g via INTRAVENOUS
  Filled 2014-07-29: qty 50

## 2014-07-29 MED ORDER — METOPROLOL TARTRATE 1 MG/ML IV SOLN
5.0000 mg | Freq: Once | INTRAVENOUS | Status: AC
  Administered 2014-07-29: 5 mg via INTRAVENOUS
  Filled 2014-07-29: qty 5

## 2014-07-29 MED ORDER — AMLODIPINE BESYLATE 10 MG PO TABS
10.0000 mg | ORAL_TABLET | Freq: Every day | ORAL | Status: DC
  Administered 2014-07-30 – 2014-08-01 (×3): 10 mg via ORAL
  Filled 2014-07-29 (×3): qty 1

## 2014-07-29 MED ORDER — LATANOPROST 0.005 % OP SOLN
1.0000 [drp] | Freq: Every day | OPHTHALMIC | Status: DC
  Administered 2014-07-29 – 2014-07-31 (×3): 1 [drp] via OPHTHALMIC
  Filled 2014-07-29: qty 2.5

## 2014-07-29 MED ORDER — MECLIZINE HCL 12.5 MG PO TABS: 12.5000 mg | ORAL_TABLET | Freq: Three times a day (TID) | ORAL | Status: DC | PRN

## 2014-07-29 MED ORDER — POTASSIUM CHLORIDE 20 MEQ PO PACK
40.0000 meq | PACK | Freq: Once | ORAL | Status: DC
  Filled 2014-07-29: qty 2

## 2014-07-29 MED ORDER — SODIUM CHLORIDE 0.9 % IJ SOLN
3.0000 mL | Freq: Two times a day (BID) | INTRAMUSCULAR | Status: DC
  Administered 2014-07-29 – 2014-07-30 (×2): 3 mL via INTRAVENOUS

## 2014-07-29 MED ORDER — INSULIN ASPART 100 UNIT/ML ~~LOC~~ SOLN
0.0000 [IU] | Freq: Three times a day (TID) | SUBCUTANEOUS | Status: DC
Start: 2014-07-30 — End: 2014-08-01
  Administered 2014-07-30: 3 [IU] via SUBCUTANEOUS

## 2014-07-29 MED ORDER — SODIUM CHLORIDE 0.9 % IJ SOLN: 3.0000 mL | INTRAMUSCULAR | Status: DC | PRN

## 2014-07-29 MED ORDER — ADULT MULTIVITAMIN W/MINERALS CH
1.0000 | ORAL_TABLET | Freq: Every day | ORAL | Status: DC
  Administered 2014-07-29 – 2014-08-01 (×4): 1 via ORAL
  Filled 2014-07-29 (×4): qty 1

## 2014-07-29 MED ORDER — POTASSIUM CHLORIDE CRYS ER 20 MEQ PO TBCR
40.0000 meq | EXTENDED_RELEASE_TABLET | Freq: Once | ORAL | Status: AC
  Administered 2014-07-29: 40 meq via ORAL

## 2014-07-29 MED ORDER — TERAZOSIN HCL 5 MG PO CAPS
20.0000 mg | ORAL_CAPSULE | Freq: Every day | ORAL | Status: DC
  Administered 2014-07-29 – 2014-07-31 (×3): 20 mg via ORAL
  Filled 2014-07-29 (×4): qty 4

## 2014-07-29 NOTE — ED Notes (Signed)
To ED via [facility] from home with c/o onset dizziness that started this morning, had an episode of pain across upper chest/shoulder area last night. Dizziness started after 0730 this am. Alert/oriented x 3

## 2014-07-29 NOTE — ED Notes (Signed)
Pt having runs of VT- Dr. Wilson Singer informed. Pt is sitting up eating lunch -- denies any c/o's. Daughter remains at bedside.

## 2014-07-29 NOTE — ED Provider Notes (Signed)
CSN: [id number]     Arrival date & time 07/29/14  3007 History   First MD Initiated Contact with Patient 07/29/14 [id number]     Chief Complaint  Patient presents with  . Dizziness  . Chest Pain     (Consider location/radiation/quality/duration/timing/severity/associated sxs/prior Treatment) HPI  73 year old male with what he calls dizziness. Symptoms have been intermittent and since this past Friday. Describes brief episodes of feeling like he may pass out. Associated with fluttering feeling in his chest. Sometimes they chest and shoulder discomfort, but not with every episode. Reports he went to his PCP on Friday and then was referred to a cardiologist who he saw earlier this week. Ot reports cardiac catheterization scheduled for this upcoming Monday. Reports no known history of coronary artery disease. Symptoms this morning a little different in that he felt off balance and that room was swaying. Currently no pain. No acute visual complaints. No tinnitus. No acute numbness, tingling or loss of strength.   Past Medical History  Diagnosis Date  . Hypertension   . Diabetes mellitus without complication   . Sleep apnea, obstructive   . Osteoporosis   . Hyperparathyroidism   . Anxiety    Past Surgical History  Procedure Laterality Date  . Hernia repair      Lt ing hernia  . Back surgery      lower back  . Parathyroidectomy  11/22/2012    Procedure: PARATHYROIDECTOMY;  Surgeon: Earnstine Regal, MD;  Location: [facility];  Service: General;  Laterality: N/A;  Parathyroidectomy   Family History  Problem Relation Age of Onset  . Diabetes Mother   . Heart disease Mother   . Diabetes Father   . Heart disease Father    History  Substance Use Topics  . Smoking status: Former Smoker -- 1.00 packs/day for 20 years    Types: Cigarettes    Quit date: 11/13/1972  . Smokeless tobacco: Never Used  . Alcohol Use: No    Review of Systems  All systems reviewed and negative, other than as noted in  HPI.   Allergies  Review of patient's allergies indicates no known allergies.  Home Medications   Prior to Admission medications   Medication Sig Start Date End Date Taking? Authorizing Provider  amLODipine-valsartan (EXFORGE) 10-320 MG per tablet Take 1 tablet by mouth daily before breakfast.     Historical Provider, MD  aspirin EC 81 MG tablet Take 162 mg by mouth daily.    Historical Provider, MD  chlorthalidone (HYGROTON) 25 MG tablet Take 25 mg by mouth daily before breakfast.     Historical Provider, MD  furosemide (LASIX) 40 MG tablet Take 40 mg by mouth daily before breakfast.    Historical Provider, MD  HYDROcodone-acetaminophen (NORCO/VICODIN) 5-325 MG per tablet Take 1-2 tablets by mouth every 4 (four) hours as needed for pain. 11/22/12   Armandina Gemma, MD  insulin aspart (NOVOLOG) 100 UNIT/ML injection Inject 11-38 Units into the skin 3 (three) times daily before meals. Morning 70-90=16 units lunch 70-90=11units dinner 70-90=13 units if greater than 450 morning 42 units lunch 34 units dinner 38 units    Historical Provider, MD  insulin glargine (LANTUS) 100 UNIT/ML injection Inject 45 Units into the skin 2 (two) times daily.    Historical Provider, MD  L-Methylfolate-B6-B12 (METANX PO) Take 40 mg by mouth 2 (two) times daily.    Historical Provider, MD  Multiple Vitamin (MULTIVITAMIN) LIQD Take 5 mLs by mouth daily.    Historical Provider, MD  simvastatin (ZOCOR) 40 MG tablet Take 40 mg by mouth at bedtime.     Historical Provider, MD  terazosin (HYTRIN) 10 MG capsule Take 20 mg by mouth at bedtime.    Historical Provider, MD   BP 107/61  Pulse 90  Temp(Src) 97.7 F (36.5 C) (Oral)  Resp 12  Ht _0  (1.778 m)  Wt 268 lb (121.564 kg)  BMI 38.45 kg/m2  SpO2 95% Physical Exam  Nursing note and vitals reviewed. Constitutional: He appears well-developed and well-nourished. No distress.  HENT:  Head: Normocephalic and atraumatic.  Eyes: Conjunctivae are normal. Right eye  exhibits no discharge. Left eye exhibits no discharge.  Neck: Neck supple.  Cardiovascular: Normal rate, regular rhythm and normal heart sounds.  Exam reveals no gallop and no friction rub.   No murmur heard. Pulmonary/Chest: Effort normal and breath sounds normal. No respiratory distress.  Abdominal: Soft. He exhibits no distension. There is no tenderness.  Musculoskeletal:  LE Edema, L>R. Non pitting. No calf tenderness.   Neurological: He is alert.  Skin: Skin is warm and dry.  Psychiatric: He has a normal mood and affect. His behavior is normal. Thought content normal.    ED Course  Procedures (including critical care time) Labs Review Labs Reviewed  CBC - Abnormal; Notable for the following:    RBC 3.66 (*)    Hemoglobin 10.4 (*)    HCT 31.6 (*)    All other components within normal limits  BASIC METABOLIC PANEL - Abnormal; Notable for the following:    Sodium 136 (*)    Potassium 3.6 (*)    Glucose, Bld 220 (*)    BUN 40 (*)    Creatinine, Ser 1.82 (*)    GFR calc non Af Amer 35 (*)    GFR calc Af Amer 41 (*)    All other components within normal limits  PRO B NATRIURETIC PEPTIDE  MAGNESIUM  I-STAT TROPOININ, ED    Imaging Review Dg Chest 2 View  07/29/2014   CLINICAL DATA:  Chest pain  EXAM: CHEST  2 VIEW  COMPARISON:  November 14, 2012  FINDINGS: There is no edema or consolidation. The heart size and pulmonary vascularity are normal. No adenopathy. There is mid to lower thoracic dextroscoliosis.  IMPRESSION: No edema or consolidation.   Electronically Signed   By: Lowella Grip M.D.   On: 07/29/2014 10:19     EKG Interpretation   Date/Time:  Wednesday July 29 2014 09:30:36 EDT Ventricular Rate:  89 PR Interval:  179 QRS Duration: 83 QT Interval:  388 QTC Calculation: 472 R Axis:   18 Text Interpretation:  Sinus rhythm Multiple ventricular premature  complexes Borderline T abnormalities, lateral leads No significant change  since Confirmed by Lenox Bink   MD, Clayson Riling ([id number]) on 07/29/2014 10:39:08 AM      MDM   Final diagnoses:  Ventricular tachycardia, non-sustained  Renal impairment  Dizziness    72yM with dizziness/lightheadedness. Symptoms not clearly vertigo or pre-syncope. Some features of both. Fairly frequent runs on nonsustained ventricular tachycardia. Symptomatic. Feels fluttering and lightheaded. Currently denies chest pain, but has had vague chest and shoulder discomfort since symptoms started. Reports just met with a cardiologist from [facility], Denman George (sp?) and from pt report is scheduled to have cath this upcoming Monday. Tried paging him three times over the past 1.5 hours w/o answer.  Pt just met him for the first time this week. Discussed with him about continuing to re-page versus calling cardiology here.  Pt has no real preference.   Given dose of metoprolol. Frequency/duration of vtach decreased. With symptoms though, particularly CP, will discuss with Cards.    Virgel Manifold, MD 07/29/14 [phone]

## 2014-07-29 NOTE — ED Notes (Signed)
CBG 172

## 2014-07-29 NOTE — Progress Notes (Signed)
VASCULAR LAB PRELIMINARY  PRELIMINARY  PRELIMINARY  PRELIMINARY  Left lower extremity venous duplex completed.    Preliminary report:  Left:  No evidence of DVT, superficial thrombosis, or Baker's cyst.  Zurie Platas, RVT 07/29/2014, 7:53 PM

## 2014-07-29 NOTE — H&P (Addendum)
Patient ID: [name] Wiley MRN: [id number], DOB/AGE: 1941/10/05   Admit date: 07/29/2014   Primary Physician: Hollace Hayward, MD Primary Cardiologist: Dr. Kerry Dory of [facility] Primary Nephrologist: Dr. Elmarie Shiley of [place]  Pt. Profile:   73 year old African American male with past medical history significant for hypertension, diabetes, obstructive sleep apnea, anxiety, hyperparathyroidism, chronic kidney disease and history of normocytic anemia presented with palpitation and was found to have NSVT vs aberrant supraventricular tachicardia  Problem List  Past Medical History  Diagnosis Date  . Hypertension   . Diabetes mellitus without complication   . Sleep apnea, obstructive   . Osteoporosis   . Hyperparathyroidism   . Anxiety     Past Surgical History  Procedure Laterality Date  . Hernia repair      Lt ing hernia  . Back surgery      lower back  . Parathyroidectomy  11/22/2012    Procedure: PARATHYROIDECTOMY;  Surgeon: Earnstine Regal, MD;  Location: [facility];  Service: General;  Laterality: N/A;  Parathyroidectomy     Allergies  No Known Allergies  HPI  The patient is a 73 year old African American male with past medical history significant for hypertension, diabetes, obstructive sleep apnea, anxiety, hyperparathyroidism, chronic kidney disease and history of normocytic anemia. Patient has been following with Dr. Posey Pronto in [place] for his chronic kidney disease and states his kidney function has been stable 6 month ago. He is scheduled to see Dr. Posey Pronto again next month. According to the patient, he denies any previous cardiac history except been told to have a cardiac murmur 40 years ago. His last stress test was over 10 years ago. He never had any cardiac catheterization before. 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 Since his retirement 10 years ago, patient has not been very active at home. He does have chronic lower extremity swelling  for which he take Lasix and chlorthalidone at home. He also has chronic orthopnea, however no significant paroxysmal nocturnal dyspnea recently. He denies any recent fever, chill and cough.   For the past 2 weeks, patient has been experiencing intermittent palpation which he says his heart would go very fast for a few seconds to a few minutes. The episodes has been increasing frequency and become more noticeable. The longest episode was last Friday on 07/24/2014. According to the patient, he was driving at the time when he felt his heart rate was going fast. He thought he was going to pass out while in the car. Eventually the patient arrived home and took some aspirin and some rest before the symptoms went away after 30 minutes. Patient sought help with his primary care physician who referred him to Dr. Kerry Dory of [facility]. He saw Dr. Denman George in the clinic on 07/28/2014, different treatment modality was explained to the patient, eventually it was decided to undergo a cardiac catheterization planned for next Monday.  However the patient's symptom recurred in the morning of 07/29/2014 prompting the patient to seek medical attention at [facility] ED. On arrival his blood pressure was 107/61. O2 saturation 95%. He was mildly tachycardic in the 90s. Initial laboratory finding was significant for sodium of 136, potassium of 3.6, creatinine was elevated at 1.82,  negative troponin, proBNP was 104, hemoglobin 10.4. Chest x-ray was negative for acute process. EKG showed possible nonsustained V. Tach vs aberrant supraventricular tachycardia. Telemetry also revealed frequent PVCs and nonsustained V. tach as well. Attempts were made to initially  contact the patient's primary cardiologist, however was not able to reach, therefore [facility] cardiology has been consulted.  Home Medications  Prior to Admission medications   Medication Sig Start Date End Date Taking? Authorizing Provider    amLODipine-valsartan (EXFORGE) 10-320 MG per tablet Take 1 tablet by mouth daily before breakfast.    Yes Historical Provider, MD  aspirin EC 325 MG tablet Take 325 mg by mouth daily.   Yes Historical Provider, MD  chlorthalidone (HYGROTON) 25 MG tablet Take 25 mg by mouth daily before breakfast.    Yes Historical Provider, MD  furosemide (LASIX) 40 MG tablet Take 40 mg by mouth 2 (two) times daily. 8am and 2 pm   Yes Historical Provider, MD  insulin aspart (NOVOLOG) 100 UNIT/ML injection Inject into the skin 3 (three) times daily before meals. SLIDING SCALE   Yes Historical Provider, MD  insulin glargine (LANTUS) 100 UNIT/ML injection Inject 48 Units into the skin 2 (two) times daily.    Yes Historical Provider, MD  latanoprost (XALATAN) 0.005 % ophthalmic solution Place 1 drop into both eyes at bedtime.    Yes Historical Provider, MD  Multiple Vitamin (MULTIVITAMIN WITH MINERALS) TABS tablet Take 1 tablet by mouth daily.   Yes Historical Provider, MD  simvastatin (ZOCOR) 40 MG tablet Take 40 mg by mouth at bedtime.    Yes Historical Provider, MD  terazosin (HYTRIN) 10 MG capsule Take 20 mg by mouth at bedtime.   Yes Historical Provider, MD  meclizine (ANTIVERT) 12.5 MG tablet Take 1 tablet (12.5 mg total) by mouth 3 (three) times daily as needed for dizziness. 07/29/14   Virgel Manifold, MD    Family History  Family History  Problem Relation Age of Onset  . Diabetes Mother   . Heart disease Mother   . Diabetes Father   . Heart disease Father     Social History  History   Social History  . Marital Status: Legally Separated    Spouse Name: N/A    Number of Children: N/A  . Years of Education: N/A   Occupational History  . Not on file.   Social History Main Topics  . Smoking status: Former Smoker -- 1.00 packs/day for 20 years    Types: Cigarettes    Quit date: 11/13/1972  . Smokeless tobacco: Never Used  . Alcohol Use: No  . Drug Use: No  . Sexual Activity: Not on file    Other Topics Concern  . Not on file   Social History Narrative  . No narrative on file     Review of Systems General:  No chills, fever, night sweats or weight changes.  Cardiovascular:  No chest pain, paroxysmal nocturnal dyspnea. +dyspnea on exertion, edema, orthopnea, palpitations Dermatological: No rash, lesions/masses Respiratory: No cough, dyspnea Urologic: No hematuria, dysuria Abdominal:   No nausea, vomiting, diarrhea, bright red blood per rectum, melena, or hematemesis Neurologic:  No visual changes, wkns, changes in mental status. All other systems reviewed and are otherwise negative except as noted above.  Physical Exam  Blood pressure 136/63, pulse 85, temperature 97.7 F (36.5 C), temperature source Oral, resp. rate 14, height 5\' 10"  (1.778 m), weight 268 lb (121.564 kg), SpO2 100.00%.  General: Pleasant, NAD Psych: Normal affect. Neuro: Alert and oriented X 3. Moves all extremities spontaneously. HEENT: Normal  Neck: Supple without bruits or JVD. Lungs:  Resp regular and unlabored, CTA. Heart: RRR no s3, s4, or murmurs. Abdomen: Soft, non-tender, non-distended, BS + x 4.  Extremities: No clubbing, cyanosis. DP/PT/Radials 2+ and equal bilaterally. Scaly skin in bilateral LE.  2+ left leg edema; 1+ right leg edema  Labs  Troponin (Point of Care Test)  Recent Labs  07/29/14 1014  TROPIPOC 0.01   No results found for this basename: CKTOTAL, CKMB, TROPONINI,  in the last 72 hours Lab Results  Component Value Date   WBC 7.8 07/29/2014   HGB 10.4* 07/29/2014   HCT 31.6* 07/29/2014   MCV 86.3 07/29/2014   PLT 231 07/29/2014    Recent Labs Lab 07/29/14 1001  NA 136*  K 3.6*  CL 100  CO2 22  BUN 40*  CREATININE 1.82*  CALCIUM 9.7  GLUCOSE 220*   No results found for this basename: CHOL, HDL, LDLCALC, TRIG   No results found for this basename: DDIMER     Radiology/Studies  Dg Chest 2 View  07/29/2014   CLINICAL DATA:  Chest pain  EXAM: CHEST  2  VIEW  COMPARISON:  November 14, 2012  FINDINGS: There is no edema or consolidation. The heart size and pulmonary vascularity are normal. No adenopathy. There is mid to lower thoracic dextroscoliosis.  IMPRESSION: No edema or consolidation.   Electronically Signed   By: Lowella Grip M.D.   On: 07/29/2014 10:19    ECG  EKG showed possible nonsustained V. Tach vs aberrant supraventricular tachycardia   ASSESSMENT AND PLAN  1. NSVT vs aberrant supraventricular tachycardia  - Discontinue the patient's chlorthalidone, start metoprolol  - Given the patient's chronic kidney disease, hydrated overnight  - Plan for cardiac catheterization tomorrow to rule out underlying ischemia, no LVgram with CKD  - obtain echo  - may require EP consult if no obvious etiology found on cath  2. Hypertension 3. Diabetes 4. obstructive sleep apnea 5. Anxiety 6. Hyperparathyroidism 7. chronic kidney disease  - follow by Dr. Elmarie Shiley as outpt 8. history of normocytic anemia  Signed, Almyra Deforest, PA-C 07/29/2014, 4:55 PM   I have examined the patient and reviewed assessment and plan and discussed with patient.  Agree with above as stated.  Arrhythmia noted on ECG. This may be SVT with aberrant conduction.  However, nonsustained ventricular tachycardia was noted on telemetry. He was already scheduled for cardiac cath. Given his renal insufficiency, we'll hydrate him overnight and perform cardiac cath in the morning. Start beta blocker for arrhythmia.  Significant asymmetric leg swelling noted as well. We'll check lower extremity Doppler to rule out DVT.  Travares Nelles S.

## 2014-07-29 NOTE — Discharge Instructions (Signed)
Dizziness °Dizziness is a common problem. It is a feeling of unsteadiness or light-headedness. You may feel like you are about to faint. Dizziness can lead to injury if you stumble or fall. A person of any age group can suffer from dizziness, but dizziness is more common in older adults. °CAUSES  °Dizziness can be caused by many different things, including: °· Middle ear problems. °· Standing for too long. °· Infections. °· An allergic reaction. °· Aging. °· An emotional response to something, such as the sight of blood. °· Side effects of medicines. °· Tiredness. °· Problems with circulation or blood pressure. °· Excessive use of alcohol or medicines, or illegal drug use. °· Breathing too fast (hyperventilation). °· An irregular heart rhythm (arrhythmia). °· A low red blood cell count (anemia). °· Pregnancy. °· Vomiting, diarrhea, fever, or other illnesses that cause body fluid loss (dehydration). °· Diseases or conditions such as Parkinson's disease, high blood pressure (hypertension), diabetes, and thyroid problems. °· Exposure to extreme heat. °DIAGNOSIS  °Your health care provider will ask about your symptoms, perform a physical exam, and perform an electrocardiogram (ECG) to record the electrical activity of your heart. Your health care provider may also perform other heart or blood tests to determine the cause of your dizziness. These may include: °· Transthoracic echocardiogram (TTE). During echocardiography, sound waves are used to evaluate how blood flows through your heart. °· Transesophageal echocardiogram (TEE). °· Cardiac monitoring. This allows your health care provider to monitor your heart rate and rhythm in real time. °· Holter monitor. This is a portable device that records your heartbeat and can help diagnose heart arrhythmias. It allows your health care provider to track your heart activity for several days if needed. °· Stress tests by exercise or by giving medicine that makes the heart beat  faster. °TREATMENT  °Treatment of dizziness depends on the cause of your symptoms and can vary greatly. °HOME CARE INSTRUCTIONS  °· Drink enough fluids to keep your urine clear or pale yellow. This is especially important in very hot weather. In older adults, it is also important in cold weather. °· Take your medicine exactly as directed if your dizziness is caused by medicines. When taking blood pressure medicines, it is especially important to get up slowly. °¨ Rise slowly from chairs and steady yourself until you feel okay. °¨ In the morning, first sit up on the side of the bed. When you feel okay, stand slowly while holding onto something until you know your balance is fine. °· Move your legs often if you need to stand in one place for a long time. Tighten and relax your muscles in your legs while standing. °· Have someone stay with you for 1-2 days if dizziness continues to be a problem. Do this until you feel you are well enough to stay alone. Have the person call your health care provider if he or she notices changes in you that are concerning. °· Do not drive or use heavy machinery if you feel dizzy. °· Do not drink alcohol. °SEEK IMMEDIATE MEDICAL CARE IF:  °· Your dizziness or light-headedness gets worse. °· You feel nauseous or vomit. °· You have problems talking, walking, or using your arms, hands, or legs. °· You feel weak. °· You are not thinking clearly or you have trouble forming sentences. It may take a friend or family member to notice this. °· You have chest pain, abdominal pain, shortness of breath, or sweating. °· Your vision changes. °· You notice   any bleeding. °· You have side effects from medicine that seems to be getting worse rather than better. °MAKE SURE YOU:  °· Understand these instructions. °· Will watch your condition. °· Will get help right away if you are not doing well or get worse. °Document Released: 04/25/2001 Document Revised: 11/04/2013 Document Reviewed: 05/19/2011 °[employer]®  Patient Information ©2015 [employer]. This information is not intended to replace advice given to you by your health care provider. Make sure you discuss any questions you have with your health care provider. ° °Benign Positional Vertigo °Vertigo means you feel like you or your surroundings are moving when they are not. Benign positional vertigo is the most common form of vertigo. Benign means that the cause of your condition is not serious. Benign positional vertigo is more common in older adults. °CAUSES  °Benign positional vertigo is the result of an upset in the labyrinth system. This is an area in the middle ear that helps control your balance. This may be caused by a viral infection, head injury, or repetitive motion. However, often no specific cause is found. °SYMPTOMS  °Symptoms of benign positional vertigo occur when you move your head or eyes in different directions. Some of the symptoms may include: °· Loss of balance and falls. °· Vomiting. °· Blurred vision. °· Dizziness. °· Nausea. °· Involuntary eye movements (nystagmus). °DIAGNOSIS  °Benign positional vertigo is usually diagnosed by physical exam. If the specific cause of your benign positional vertigo is unknown, your caregiver may perform imaging tests, such as magnetic resonance imaging (MRI) or computed tomography (CT). °TREATMENT  °Your caregiver may recommend movements or procedures to correct the benign positional vertigo. Medicines such as meclizine, benzodiazepines, and medicines for nausea may be used to treat your symptoms. In rare cases, if your symptoms are caused by certain conditions that affect the inner ear, you may need surgery. °HOME CARE INSTRUCTIONS  °· Follow your caregiver's instructions. °· Move slowly. Do not make sudden body or head movements. °· Avoid driving. °· Avoid operating heavy machinery. °· Avoid performing any tasks that would be dangerous to you or others during a vertigo episode. °· Drink enough fluids to keep  your urine clear or pale yellow. °SEEK IMMEDIATE MEDICAL CARE IF:  °· You develop problems with walking, weakness, numbness, or using your arms, hands, or legs. °· You have difficulty speaking. °· You develop severe headaches. °· Your nausea or vomiting continues or gets worse. °· You develop visual changes. °· Your family or friends notice any behavioral changes. °· Your condition gets worse. °· You have a fever. °· You develop a stiff neck or sensitivity to light. °MAKE SURE YOU:  °· Understand these instructions. °· Will watch your condition. °· Will get help right away if you are not doing well or get worse. °Document Released: 08/07/2006 Document Revised: 01/22/2012 Document Reviewed: 07/20/2011 °ExitCare® Patient Information ©2015 [employer]. This information is not intended to replace advice given to you by your health care provider. Make sure you discuss any questions you have with your health care provider. ° °

## 2014-07-30 ENCOUNTER — Encounter (HOSPITAL_COMMUNITY)
Admission: EM | Disposition: A | Discharge status: Home / Self Care | Payer: Self-pay | Source: Home / Self Care | Attending: Interventional Cardiology

## 2014-07-30 DIAGNOSIS — I059 Rheumatic mitral valve disease, unspecified: Secondary | ICD-10-CM

## 2014-07-30 DIAGNOSIS — R42 Dizziness and giddiness: Secondary | ICD-10-CM

## 2014-07-30 DIAGNOSIS — N183 Chronic kidney disease, stage 3 unspecified: Secondary | ICD-10-CM

## 2014-07-30 DIAGNOSIS — I4729 Other ventricular tachycardia: Secondary | ICD-10-CM

## 2014-07-30 DIAGNOSIS — E1129 Type 2 diabetes mellitus with other diabetic kidney complication: Secondary | ICD-10-CM

## 2014-07-30 DIAGNOSIS — N058 Unspecified nephritic syndrome with other morphologic changes: Secondary | ICD-10-CM

## 2014-07-30 DIAGNOSIS — I472 Ventricular tachycardia: Secondary | ICD-10-CM

## 2014-07-30 HISTORY — PX: LEFT HEART CATHETERIZATION WITH CORONARY ANGIOGRAM: SHX5451

## 2014-07-30 LAB — PROTIME-INR
INR: 1.08 (ref 0.00–1.49)
PROTHROMBIN TIME: 14 s (ref 11.6–15.2)

## 2014-07-30 LAB — TROPONIN I: Troponin I: <0.3 ng/mL (ref <0.30)

## 2014-07-30 LAB — GLUCOSE, CAPILLARY
GLUCOSE-CAPILLARY: 105 mg/dL — AB (ref 70–99)
GLUCOSE-CAPILLARY: 78 mg/dL (ref 70–99)
Glucose-Capillary: 131 mg/dL — ABNORMAL HIGH (ref 70–99)
Glucose-Capillary: 140 mg/dL — ABNORMAL HIGH (ref 70–99)

## 2014-07-30 LAB — BASIC METABOLIC PANEL
Anion gap: 12 (ref 5–15)
BUN: 38 mg/dL — AB (ref 6–23)
CO2: 23 mEq/L (ref 19–32)
CREATININE: 1.89 mg/dL — AB (ref 0.50–1.35)
Calcium: 9.5 mg/dL (ref 8.4–10.5)
Chloride: 103 mEq/L (ref 96–112)
GFR, EST AFRICAN AMERICAN: 39 mL/min — AB (ref >90)
GFR, EST NON AFRICAN AMERICAN: 34 mL/min — AB (ref >90)
GLUCOSE: 172 mg/dL — AB (ref 70–99)
POTASSIUM: 4.6 meq/L (ref 3.7–5.3)
Sodium: 138 mEq/L (ref 137–147)

## 2014-07-30 LAB — LIPID PANEL
CHOL/HDL RATIO: 4 ratio
Cholesterol: 109 mg/dL (ref 0–200)
HDL: 27 mg/dL — AB (ref >39)
LDL CALC: 32 mg/dL (ref 0–99)
Triglycerides: 250 mg/dL — ABNORMAL HIGH (ref <150)
VLDL: 50 mg/dL — ABNORMAL HIGH (ref 0–40)

## 2014-07-30 LAB — HEMOGLOBIN A1C
Hgb A1c MFr Bld: 7.3 % — ABNORMAL HIGH (ref <5.7)
MEAN PLASMA GLUCOSE: 163 mg/dL — AB (ref <117)

## 2014-07-30 SURGERY — LEFT HEART CATHETERIZATION WITH CORONARY ANGIOGRAM
Anesthesia: LOCAL

## 2014-07-30 MED ORDER — HEPARIN SODIUM (PORCINE) 1000 UNIT/ML IJ SOLN
INTRAMUSCULAR | Status: AC
  Filled 2014-07-30: qty 1

## 2014-07-30 MED ORDER — VERAPAMIL HCL 2.5 MG/ML IV SOLN
INTRAVENOUS | Status: AC
  Filled 2014-07-30: qty 2

## 2014-07-30 MED ORDER — METOPROLOL TARTRATE 25 MG PO TABS
37.5000 mg | ORAL_TABLET | Freq: Two times a day (BID) | ORAL | Status: DC
  Administered 2014-07-30 (×2): 37.5 mg via ORAL
  Filled 2014-07-30 (×4): qty 1

## 2014-07-30 MED ORDER — NITROGLYCERIN 1 MG/10 ML FOR IR/CATH LAB
INTRA_ARTERIAL | Status: AC
  Filled 2014-07-30: qty 10

## 2014-07-30 MED ORDER — FENTANYL CITRATE 0.05 MG/ML IJ SOLN
INTRAMUSCULAR | Status: AC
  Filled 2014-07-30: qty 2

## 2014-07-30 MED ORDER — ACETAMINOPHEN 325 MG PO TABS
650.0000 mg | ORAL_TABLET | ORAL | Status: DC | PRN
  Administered 2014-08-01: 650 mg via ORAL
  Filled 2014-07-30: qty 2

## 2014-07-30 MED ORDER — ONDANSETRON HCL 4 MG/2ML IJ SOLN: 4.0000 mg | Freq: Four times a day (QID) | INTRAMUSCULAR | Status: DC | PRN

## 2014-07-30 MED ORDER — HEPARIN SODIUM (PORCINE) 5000 UNIT/ML IJ SOLN: 5000.0000 [IU] | Freq: Three times a day (TID) | INTRAMUSCULAR | Status: DC

## 2014-07-30 MED ORDER — FENOFIBRATE 54 MG PO TABS
54.0000 mg | ORAL_TABLET | Freq: Every day | ORAL | Status: DC
  Administered 2014-07-30 – 2014-07-31 (×2): 54 mg via ORAL
  Filled 2014-07-30 (×2): qty 1

## 2014-07-30 MED ORDER — LIDOCAINE HCL (PF) 1 % IJ SOLN
INTRAMUSCULAR | Status: AC
  Filled 2014-07-30: qty 30

## 2014-07-30 MED ORDER — FUROSEMIDE 40 MG PO TABS
40.0000 mg | ORAL_TABLET | Freq: Two times a day (BID) | ORAL | Status: DC
  Administered 2014-07-31 – 2014-08-01 (×3): 40 mg via ORAL
  Filled 2014-07-30 (×5): qty 1

## 2014-07-30 MED ORDER — MIDAZOLAM HCL 2 MG/2ML IJ SOLN
INTRAMUSCULAR | Status: AC
  Filled 2014-07-30: qty 2

## 2014-07-30 MED ORDER — HEPARIN (PORCINE) IN NACL 2-0.9 UNIT/ML-% IJ SOLN
INTRAMUSCULAR | Status: AC
  Filled 2014-07-30: qty 1500

## 2014-07-30 MED ORDER — SODIUM CHLORIDE 0.9 % IV SOLN: INTRAVENOUS | Status: AC

## 2014-07-30 NOTE — Progress Notes (Addendum)
Subjective: No complaints  Objective: Vital signs in last 24 hours: Temp:  [97.7 F (36.5 C)-98.6 F (37 C)] 98.6 F (37 C) (09/17 0500) Pulse Rate:  [67-112] 87 (09/17 0500) Resp:  [12-25] 16 (09/17 0500) BP: (92-137)/(56-93) 126/82 mmHg (09/17 0500) SpO2:  [94 %-100 %] 95 % (09/17 0500) Weight:  [251 lb 12.8 oz (114.216 kg)-268 lb (121.564 kg)] 251 lb 12.8 oz (114.216 kg) (09/17 0500) Last BM Date: 07/29/14  Intake/Output from previous day: 09/16 0701 - 09/17 0700 In: 1000 [I.V.:1000] Out: 500 [Urine:500] Intake/Output this shift: Total I/O In: -  Out: 500 [Urine:500]  Medications Current Facility-Administered Medications  Medication Dose Route Frequency Provider Last Rate Last Dose  . 0.9 %  sodium chloride infusion  250 mL Intravenous PRN Almyra Deforest, PA      . 0.9 %  sodium chloride infusion  1 mL/kg/hr Intravenous Continuous Almyra Deforest, PA 121.6 mL/hr at 07/30/14 0415 1 mL/kg/hr at 07/30/14 0415  . acetaminophen (TYLENOL) tablet 650 mg  650 mg Oral Q4H PRN Almyra Deforest, PA      . amLODipine (NORVASC) tablet 10 mg  10 mg Oral Daily Almyra Deforest, [place]      . aspirin EC tablet 81 mg  81 mg Oral Daily Almyra Deforest, [place]      . atorvastatin (LIPITOR) tablet 20 mg  20 mg Oral QHS Jettie Booze, MD   20 mg at 07/29/14 2217  . furosemide (LASIX) tablet 40 mg  40 mg Oral BID Almyra Deforest, [place]      . heparin injection 5,000 Units  5,000 Units Subcutaneous 3 times per day Almyra Deforest, PA   5,000 Units at 07/30/14 2353  . insulin aspart (novoLOG) injection 0-20 Units  0-20 Units Subcutaneous TID WC Almyra Deforest, PA   3 Units at 07/30/14 [phone]  . insulin glargine (LANTUS) injection 48 Units  48 Units Subcutaneous BID Almyra Deforest, [place]   48 Units at 07/29/14 2232  . latanoprost (XALATAN) 0.005 % ophthalmic solution 1 drop  1 drop Both Eyes QHS Almyra Deforest, [place]   1 drop at 07/29/14 2226  . metoprolol tartrate (LOPRESSOR) tablet 25 mg  25 mg Oral BID Almyra Deforest, PA   25 mg at 07/29/14 2217  . multivitamin with  minerals tablet 1 tablet  1 tablet Oral Daily Almyra Deforest, [place]   1 tablet at 07/29/14 2216  . nitroGLYCERIN (NITROSTAT) SL tablet 0.4 mg  0.4 mg Sublingual Q5 Min x 3 PRN Almyra Deforest, PA      . ondansetron (ZOFRAN) injection 4 mg  4 mg Intravenous Q6H PRN Almyra Deforest, PA      . sodium chloride 0.9 % injection 3 mL  3 mL Intravenous Q12H Almyra Deforest, PA   3 mL at 07/29/14 2221  . sodium chloride 0.9 % injection 3 mL  3 mL Intravenous PRN Almyra Deforest, PA      . terazosin (HYTRIN) capsule 20 mg  20 mg Oral QHS Almyra Deforest, PA   20 mg at 07/29/14 2216    PE: General appearance: alert, cooperative and no distress Lungs: clear to auscultation bilaterally Heart: regular rate and rhythm and 1/6 sys MM Extremities: 1+ LEE L>R Pulses: 2+ and symmetric Skin: Warm and dry Neurologic: Grossly normal  Lab Results:   Recent Labs  07/29/14 1001 07/29/14 2021  WBC 7.8 9.3  HGB 10.4* 11.2*  HCT 31.6* 33.9*  PLT 231 268   BMET  Recent Labs  07/29/14 1001  07/29/14 2021 07/30/14 0115  NA 136*  --  138  K 3.6*  --  4.6  CL 100  --  103  CO2 22  --  23  GLUCOSE 220*  --  172*  BUN 40*  --  38*  CREATININE 1.82* 1.73* 1.89*  CALCIUM 9.7  --  9.5   PT/INR No results found for this basename: LABPROT, INR,  in the last 72 hours Cholesterol  Recent Labs  07/30/14 0115  CHOL 109   Lipid Panel     Component Value Date/Time   CHOL 109 07/30/2014 0115   TRIG 250* 07/30/2014 0115   HDL 27* 07/30/2014 0115   CHOLHDL 4.0 07/30/2014 0115   VLDL 50* 07/30/2014 0115   LDLCALC 32 07/30/2014 0115       Assessment/Plan  73 year old African American male with past medical history significant for hypertension, diabetes, obstructive sleep apnea, anxiety, hyperparathyroidism, chronic kidney disease and history of normocytic anemia presented with palpitation and was found to have NSVT vs aberrant supraventricular tachicardia  Active Problems:   Nonsustained paroxysmal ventricular tachycardia 1. NSVT vs aberrant  supraventricular tachycardia  Left heart cath planned for this afternoon.  Lopressor 25bid started yesterday.  Still having a lot of short runs and PVCs.  Will increase lopressors to 37.5.   Echo pending.  No prior echo in the system.   - may require EP consult if no obvious etiology found on cath  2. Hypertension   BID stable.  Also on Lasix 40PO bid.  3. Diabetes   Lantus and SS, A1C 7.3 4. obstructive sleep apnea  5. Anxiety  6. Hyperparathyroidism  7. chronic kidney disease   SCr 1.73>>1.89.  Getting hydrated.  Holding lasix today. BNP was 104.8 8. history of normocytic anemia 9. Dyslipidemia  LDL 32 but TG 250 and HDL 27.  On lipitor.  Adding fenofibrate.  Needs low carb diet. 10.  Obesity  Will recommend nutrition referral.   LE venous doppler-left: No evidence of DVT, superficial thrombosis, or Baker's cyst.     LOS: 1 day    [name] Wiley, [name] Wiley 07/30/2014 8:19 AM  Personally seen and examined. Agree with above. Will proceed with cath, IV fluids (159ml/hr), dye sparing, no LV gram.  -If stenosis found, likely staged procedure. -If no explanation by cath, EP consult.  -Possible VT from RVOT. Increased Bb.  -Dizzy, palps at home. Once while driving.   Candee Furbish, MD

## 2014-07-30 NOTE — Progress Notes (Signed)
  Echocardiogram 2D Echocardiogram has been performed.  [name] Wiley M 07/30/2014, 10:24 AM

## 2014-07-30 NOTE — CV Procedure (Signed)
    Cardiac Catheterization Procedure Note  Name: [name] Wiley MRN: [id number] DOB: Jul 03, 1941  Procedure: Left Heart Cath, Selective Coronary Angiography  Indication: Ventricular tachycardia   Procedural Details: The right wrist was prepped, draped, and anesthetized with 1% lidocaine. Using the modified Seldinger technique, a 5 French Slender sheath was introduced into the right radial artery. 3 mg of verapamil was administered through the sheath, weight-based unfractionated heparin was administered intravenously. Standard Judkins catheters (JL3.0 guide for LAD, JL3.5 for LCx) were used for selective coronary angiography. Catheter exchanges were performed over an exchange length guidewire. There were no immediate procedural complications. A TR band was used for radial hemostasis at the completion of the procedure.  The patient was transferred to the post catheterization recovery area for further monitoring.  Procedural Findings: Hemodynamics: AO 125/74 LV 126/17  Coronary angiography: Coronary dominance: codominant  Left mainstem: Very short vessel  Left anterior descending (LAD): No angiographic CAD.   Left circumflex (LCx): There was a moderate ramus with no angiographic CAD.  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   Right coronary artery (RCA): No angiographic CAD, codominant with LCx.   Left ventriculography: Not done, CKD  Contrast: 55 cc  Final Conclusions:  No angiographic CAD.  NSVT triggered by catheters in ventricle.  Will arrange for EP consult.  Will hydrate post-cath.  Will also arrange for peripheral arterial dopplers given ulcer on right 5th toe.   Loralie Champagne MD, [facility] 07/30/2014, 1:49 PM

## 2014-07-30 NOTE — Progress Notes (Signed)
Cardiologist on call notified of pt having up to 9 beats of v-tach, pt's hr up to the 140s non-sustaining as well as pt having frequent bigeminy PVCs. Pt asymptomatic. Pt given scheduled dose of PO metoprolol at 2326. No new orders given at this time. Will continue to monitor the pt. Hoover Brunette, RN

## 2014-07-30 NOTE — H&P (View-Only) (Signed)
Subjective: No complaints  Objective: Vital signs in last 24 hours: Temp:  [97.7 F (36.5 C)-98.6 F (37 C)] 98.6 F (37 C) (09/17 0500) Pulse Rate:  [67-112] 87 (09/17 0500) Resp:  [12-25] 16 (09/17 0500) BP: (92-137)/(56-93) 126/82 mmHg (09/17 0500) SpO2:  [94 %-100 %] 95 % (09/17 0500) Weight:  [251 lb 12.8 oz (114.216 kg)-268 lb (121.564 kg)] 251 lb 12.8 oz (114.216 kg) (09/17 0500) Last BM Date: 07/29/14  Intake/Output from previous day: 09/16 0701 - 09/17 0700 In: 1000 [I.V.:1000] Out: 500 [Urine:500] Intake/Output this shift: Total I/O In: -  Out: 500 [Urine:500]  Medications Current Facility-Administered Medications  Medication Dose Route Frequency Provider Last Rate Last Dose  . 0.9 %  sodium chloride infusion  250 mL Intravenous PRN Almyra Deforest, PA      . 0.9 %  sodium chloride infusion  1 mL/kg/hr Intravenous Continuous Almyra Deforest, PA 121.6 mL/hr at 07/30/14 0415 1 mL/kg/hr at 07/30/14 0415  . acetaminophen (TYLENOL) tablet 650 mg  650 mg Oral Q4H PRN Almyra Deforest, PA      . amLODipine (NORVASC) tablet 10 mg  10 mg Oral Daily Almyra Deforest, [place]      . aspirin EC tablet 81 mg  81 mg Oral Daily Almyra Deforest, [place]      . atorvastatin (LIPITOR) tablet 20 mg  20 mg Oral QHS Jettie Booze, MD   20 mg at 07/29/14 2217  . furosemide (LASIX) tablet 40 mg  40 mg Oral BID Almyra Deforest, [place]      . heparin injection 5,000 Units  5,000 Units Subcutaneous 3 times per day Almyra Deforest, PA   5,000 Units at 07/30/14 6837  . insulin aspart (novoLOG) injection 0-20 Units  0-20 Units Subcutaneous TID WC Almyra Deforest, PA   3 Units at 07/30/14 [phone]  . insulin glargine (LANTUS) injection 48 Units  48 Units Subcutaneous BID Almyra Deforest, [place]   48 Units at 07/29/14 2232  . latanoprost (XALATAN) 0.005 % ophthalmic solution 1 drop  1 drop Both Eyes QHS Almyra Deforest, [place]   1 drop at 07/29/14 2226  . metoprolol tartrate (LOPRESSOR) tablet 25 mg  25 mg Oral BID Almyra Deforest, PA   25 mg at 07/29/14 2217  . multivitamin with  minerals tablet 1 tablet  1 tablet Oral Daily Almyra Deforest, [place]   1 tablet at 07/29/14 2216  . nitroGLYCERIN (NITROSTAT) SL tablet 0.4 mg  0.4 mg Sublingual Q5 Min x 3 PRN Almyra Deforest, PA      . ondansetron (ZOFRAN) injection 4 mg  4 mg Intravenous Q6H PRN Almyra Deforest, PA      . sodium chloride 0.9 % injection 3 mL  3 mL Intravenous Q12H Almyra Deforest, PA   3 mL at 07/29/14 2221  . sodium chloride 0.9 % injection 3 mL  3 mL Intravenous PRN Almyra Deforest, PA      . terazosin (HYTRIN) capsule 20 mg  20 mg Oral QHS Almyra Deforest, PA   20 mg at 07/29/14 2216    PE: General appearance: alert, cooperative and no distress Lungs: clear to auscultation bilaterally Heart: regular rate and rhythm and 1/6 sys MM Extremities: 1+ LEE L>R Pulses: 2+ and symmetric Skin: Warm and dry Neurologic: Grossly normal  Lab Results:   Recent Labs  07/29/14 1001 07/29/14 2021  WBC 7.8 9.3  HGB 10.4* 11.2*  HCT 31.6* 33.9*  PLT 231 268   BMET  Recent Labs  07/29/14 1001  07/29/14 2021 07/30/14 0115  NA 136*  --  138  K 3.6*  --  4.6  CL 100  --  103  CO2 22  --  23  GLUCOSE 220*  --  172*  BUN 40*  --  38*  CREATININE 1.82* 1.73* 1.89*  CALCIUM 9.7  --  9.5   PT/INR No results found for this basename: LABPROT, INR,  in the last 72 hours Cholesterol  Recent Labs  07/30/14 0115  CHOL 109   Lipid Panel     Component Value Date/Time   CHOL 109 07/30/2014 0115   TRIG 250* 07/30/2014 0115   HDL 27* 07/30/2014 0115   CHOLHDL 4.0 07/30/2014 0115   VLDL 50* 07/30/2014 0115   LDLCALC 32 07/30/2014 0115       Assessment/Plan  73 year old African American male with past medical history significant for hypertension, diabetes, obstructive sleep apnea, anxiety, hyperparathyroidism, chronic kidney disease and history of normocytic anemia presented with palpitation and was found to have NSVT vs aberrant supraventricular tachicardia  Active Problems:   Nonsustained paroxysmal ventricular tachycardia 1. NSVT vs aberrant  supraventricular tachycardia  Left heart cath planned for this afternoon.  Lopressor 25bid started yesterday.  Still having a lot of short runs and PVCs.  Will increase lopressors to 37.5.   Echo pending.  No prior echo in the system.   - may require EP consult if no obvious etiology found on cath  2. Hypertension   BID stable.  Also on Lasix 40PO bid.  3. Diabetes   Lantus and SS, A1C 7.3 4. obstructive sleep apnea  5. Anxiety  6. Hyperparathyroidism  7. chronic kidney disease   SCr 1.73>>1.89.  Getting hydrated.  Holding lasix today. BNP was 104.8 8. history of normocytic anemia 9. Dyslipidemia  LDL 32 but TG 250 and HDL 27.  On lipitor.  Adding fenofibrate.  Needs low carb diet. 10.  Obesity  Will recommend nutrition referral.   LE venous doppler-left: No evidence of DVT, superficial thrombosis, or Baker's cyst.     LOS: 1 day    HAGER, BRYAN PA-C 07/30/2014 8:19 AM  Personally seen and examined. Agree with above. Will proceed with cath, IV fluids (17ml/hr), dye sparing, no LV gram.  -If stenosis found, likely staged procedure. -If no explanation by cath, EP consult.  -Possible VT from RVOT. Increased Bb.  -Dizzy, palps at home. Once while driving.   Candee Furbish, MD

## 2014-07-30 NOTE — Progress Notes (Signed)
Pt asked about viewing Cath Video but refused stating it may scare him out of going through with the procedure done. Pt's cath lab check list is in the process of being complete. Pt has 2 IV sites, one of which has his pre-cath fluids infusing. Pt will be given pre-cath ASA around 0600. Pt's consent form has been signed. Pt has been NPO since midnight. Pt's groin as well as wrist has been clipped. Pt's pre-procedure labs & test have all been done besides the PT/INR which are scheduled to be drawn at 0757. Hoover Brunette, RN

## 2014-07-30 NOTE — Interval H&P Note (Signed)
Cath Lab Visit (complete for each Cath Lab visit)  Clinical Evaluation Leading to the Procedure:   ACS: No.  Non-ACS:    Anginal Classification: CCS II  Anti-ischemic medical therapy: Minimal Therapy (1 class of medications)  Non-Invasive Test Results: No non-invasive testing performed  Prior CABG: No previous CABG      History and Physical Interval Note:  07/30/2014 1:09 PM  [name] Wiley  has presented today for surgery, with the diagnosis of chest pain  The various methods of treatment have been discussed with the patient and family. After consideration of risks, benefits and other options for treatment, the patient has consented to  Procedure(s): LEFT HEART CATHETERIZATION WITH CORONARY ANGIOGRAM (N/A) as a surgical intervention .  The patient's history has been reviewed, patient examined, no change in status, stable for surgery.  I have reviewed the patient's chart and labs.  Questions were answered to the patient's satisfaction.     Erik Nessel [employer]

## 2014-07-31 ENCOUNTER — Encounter (HOSPITAL_COMMUNITY)
Admission: EM | Disposition: A | Discharge status: Home / Self Care | Payer: Self-pay | Source: Home / Self Care | Attending: Interventional Cardiology

## 2014-07-31 DIAGNOSIS — Z8249 Family history of ischemic heart disease and other diseases of the circulatory system: Secondary | ICD-10-CM | POA: Diagnosis not present

## 2014-07-31 DIAGNOSIS — E213 Hyperparathyroidism, unspecified: Secondary | ICD-10-CM | POA: Diagnosis present

## 2014-07-31 DIAGNOSIS — F411 Generalized anxiety disorder: Secondary | ICD-10-CM | POA: Diagnosis present

## 2014-07-31 DIAGNOSIS — Z794 Long term (current) use of insulin: Secondary | ICD-10-CM | POA: Diagnosis not present

## 2014-07-31 DIAGNOSIS — M810 Age-related osteoporosis without current pathological fracture: Secondary | ICD-10-CM | POA: Diagnosis present

## 2014-07-31 DIAGNOSIS — Z7982 Long term (current) use of aspirin: Secondary | ICD-10-CM | POA: Diagnosis not present

## 2014-07-31 DIAGNOSIS — R0601 Orthopnea: Secondary | ICD-10-CM | POA: Diagnosis present

## 2014-07-31 DIAGNOSIS — I472 Ventricular tachycardia, unspecified: Secondary | ICD-10-CM

## 2014-07-31 DIAGNOSIS — L97509 Non-pressure chronic ulcer of other part of unspecified foot with unspecified severity: Secondary | ICD-10-CM

## 2014-07-31 DIAGNOSIS — N189 Chronic kidney disease, unspecified: Secondary | ICD-10-CM | POA: Diagnosis present

## 2014-07-31 DIAGNOSIS — R002 Palpitations: Secondary | ICD-10-CM | POA: Diagnosis present

## 2014-07-31 DIAGNOSIS — I4729 Other ventricular tachycardia: Secondary | ICD-10-CM | POA: Diagnosis present

## 2014-07-31 DIAGNOSIS — E669 Obesity, unspecified: Secondary | ICD-10-CM | POA: Diagnosis present

## 2014-07-31 DIAGNOSIS — Z87891 Personal history of nicotine dependence: Secondary | ICD-10-CM | POA: Diagnosis not present

## 2014-07-31 DIAGNOSIS — Z833 Family history of diabetes mellitus: Secondary | ICD-10-CM | POA: Diagnosis not present

## 2014-07-31 DIAGNOSIS — I129 Hypertensive chronic kidney disease with stage 1 through stage 4 chronic kidney disease, or unspecified chronic kidney disease: Secondary | ICD-10-CM | POA: Diagnosis present

## 2014-07-31 DIAGNOSIS — G4733 Obstructive sleep apnea (adult) (pediatric): Secondary | ICD-10-CM | POA: Diagnosis present

## 2014-07-31 DIAGNOSIS — E785 Hyperlipidemia, unspecified: Secondary | ICD-10-CM | POA: Diagnosis present

## 2014-07-31 DIAGNOSIS — E119 Type 2 diabetes mellitus without complications: Secondary | ICD-10-CM | POA: Diagnosis present

## 2014-07-31 HISTORY — PX: V-TACH ABLATION: SHX5498

## 2014-07-31 LAB — CBC
HCT: 35.5 % — ABNORMAL LOW (ref 39.0–52.0)
HEMOGLOBIN: 11.7 g/dL — AB (ref 13.0–17.0)
MCH: 28.1 pg (ref 26.0–34.0)
MCHC: 33 g/dL (ref 30.0–36.0)
MCV: 85.3 fL (ref 78.0–100.0)
Platelets: 245 10*3/uL (ref 150–400)
RBC: 4.16 MIL/uL — ABNORMAL LOW (ref 4.22–5.81)
RDW: 13.7 % (ref 11.5–15.5)
WBC: 9.7 10*3/uL (ref 4.0–10.5)

## 2014-07-31 LAB — GLUCOSE, CAPILLARY
Glucose-Capillary: 92 mg/dL (ref 70–99)
Glucose-Capillary: 125 mg/dL — ABNORMAL HIGH (ref 70–99)
Glucose-Capillary: 139 mg/dL — ABNORMAL HIGH (ref 70–99)

## 2014-07-31 SURGERY — V-TACH ABLATION
Anesthesia: LOCAL

## 2014-07-31 MED ORDER — HEPARIN (PORCINE) IN NACL 2-0.9 UNIT/ML-% IJ SOLN
INTRAMUSCULAR | Status: AC
  Filled 2014-07-31: qty 500

## 2014-07-31 MED ORDER — MIDAZOLAM HCL 5 MG/5ML IJ SOLN
INTRAMUSCULAR | Status: AC
  Filled 2014-07-31: qty 5

## 2014-07-31 MED ORDER — SODIUM CHLORIDE 0.9 % IJ SOLN
3.0000 mL | Freq: Two times a day (BID) | INTRAMUSCULAR | Status: DC
  Administered 2014-07-31: 3 mL via INTRAVENOUS

## 2014-07-31 MED ORDER — SODIUM CHLORIDE 0.9 % IJ SOLN: 3.0000 mL | INTRAMUSCULAR | Status: DC | PRN

## 2014-07-31 MED ORDER — ONDANSETRON HCL 4 MG/2ML IJ SOLN: 4.0000 mg | Freq: Four times a day (QID) | INTRAMUSCULAR | Status: DC | PRN

## 2014-07-31 MED ORDER — METOPROLOL TARTRATE 50 MG PO TABS
50.0000 mg | ORAL_TABLET | Freq: Two times a day (BID) | ORAL | Status: DC
  Administered 2014-07-31 – 2014-08-01 (×3): 50 mg via ORAL
  Filled 2014-07-31 (×4): qty 1

## 2014-07-31 MED ORDER — SODIUM CHLORIDE 0.9 % IV SOLN: 250.0000 mL | INTRAVENOUS | Status: DC | PRN

## 2014-07-31 MED ORDER — HYDRALAZINE HCL 20 MG/ML IJ SOLN
INTRAMUSCULAR | Status: AC
  Filled 2014-07-31: qty 1

## 2014-07-31 MED ORDER — BUPIVACAINE HCL (PF) 0.25 % IJ SOLN
INTRAMUSCULAR | Status: AC
  Filled 2014-07-31: qty 60

## 2014-07-31 MED ORDER — FENTANYL CITRATE 0.05 MG/ML IJ SOLN
INTRAMUSCULAR | Status: AC
  Filled 2014-07-31: qty 2

## 2014-07-31 MED ORDER — HYDRALAZINE HCL 20 MG/ML IJ SOLN: 10.0000 mg | INTRAMUSCULAR | Status: DC | PRN

## 2014-07-31 MED ORDER — HEPARIN SODIUM (PORCINE) 5000 UNIT/ML IJ SOLN
5000.0000 [IU] | Freq: Three times a day (TID) | INTRAMUSCULAR | Status: DC
  Administered 2014-08-01: 5000 [IU] via SUBCUTANEOUS
  Filled 2014-07-31 (×3): qty 1

## 2014-07-31 MED ORDER — ACETAMINOPHEN 325 MG PO TABS: 650.0000 mg | ORAL_TABLET | ORAL | Status: DC | PRN

## 2014-07-31 MED ORDER — METOPROLOL TARTRATE 1 MG/ML IV SOLN
INTRAVENOUS | Status: AC
  Filled 2014-07-31: qty 5

## 2014-07-31 MED ORDER — METOPROLOL TARTRATE 1 MG/ML IV SOLN
5.0000 mg | INTRAVENOUS | Status: DC | PRN
  Administered 2014-07-31: 5 mg via INTRAVENOUS
  Filled 2014-07-31: qty 5

## 2014-07-31 NOTE — Consult Note (Signed)
ELECTROPHYSIOLOGY CONSULT NOTE    Patient ID: [name] Wiley MRN: [id number], DOB/AGE: 05-05-1941 73 y.o.  Admit date: 07/29/2014 Date of Consult: 07-31-14  Primary Physician: Hollace Hayward, MD Primary Cardiologist: [place]  Reason for Consultation: VT  HPI:  [name] Wiley is a 73 y.o. male with a past medical history significant for diabetes, hypertension, sleep apnea, hyperparathyroidism, chronic kidney disease, and anxiety.  Over the last 3 weeks he has developed palpitations associated with dizziness and pre-sycnope that are increasing in duration and freqeuncy.  On the day of admission, his symptoms lasted longer than usual and he called EMS and was brought to [facility] for further evaluation.  Catheterization this admission demonstrated normal coronaries.  Echo this admission demonstrated EF 92-33%, grade 2 diastolic dysfunction, mild to moderate MR, mild to moderate TR, LA 41.  He denies chest pain, shortness of breath, frank syncope, recent fevers, chills, nausea or vomiting.  He has chronic LE edema.  ROS is otherwise negative.  He has had no medication changes, is not using OTC diet pills or sudafed.   Lab work is reviewed and notable for CKD, normal TSH, normal electrolytes.  He does not smoke, drinks an occasional glass of wine, and denies recreational drug use.  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 He ambulates with a cane.   EP has been asked to evaluate for treatment options.   Past Medical History  Diagnosis Date  . Hypertension   . Diabetes mellitus without complication   . Sleep apnea, obstructive   . Osteoporosis   . Hyperparathyroidism   . Anxiety   . Palpitations      Surgical History:  Past Surgical History  Procedure Laterality Date  . Hernia repair      Lt ing hernia  . Back surgery      lower back  . Parathyroidectomy  11/22/2012    Procedure: PARATHYROIDECTOMY;  Surgeon: Earnstine Regal, MD;  Location: [facility]  ORS;  Service: General;  Laterality: N/A;  Parathyroidectomy     Prescriptions prior to admission  Medication Sig Dispense Refill  . amLODipine-valsartan (EXFORGE) 10-320 MG per tablet Take 1 tablet by mouth daily before breakfast.       . aspirin EC 325 MG tablet Take 325 mg by mouth daily.      . chlorthalidone (HYGROTON) 25 MG tablet Take 25 mg by mouth daily before breakfast.       . furosemide (LASIX) 40 MG tablet Take 40 mg by mouth 2 (two) times daily. 8am and 2 pm      . insulin aspart (NOVOLOG) 100 UNIT/ML injection Inject into the skin 3 (three) times daily before meals. SLIDING SCALE      . insulin glargine (LANTUS) 100 UNIT/ML injection Inject 48 Units into the skin 2 (two) times daily.       Marland Kitchen latanoprost (XALATAN) 0.005 % ophthalmic solution Place 1 drop into both eyes at bedtime.       . Multiple Vitamin (MULTIVITAMIN WITH MINERALS) TABS tablet Take 1 tablet by mouth daily.      . simvastatin (ZOCOR) 40 MG tablet Take 40 mg by mouth at bedtime.       Marland Kitchen terazosin (HYTRIN) 10 MG capsule Take 20 mg by mouth at bedtime.        Inpatient Medications:  . amLODipine  10 mg Oral Daily  . aspirin EC  81 mg Oral Daily  . atorvastatin  20 mg Oral  QHS  . fenofibrate  54 mg Oral Daily  . furosemide  40 mg Oral BID  . insulin aspart  0-20 Units Subcutaneous TID WC  . insulin glargine  48 Units Subcutaneous BID  . latanoprost  1 drop Both Eyes QHS  . metoprolol tartrate  50 mg Oral BID  . multivitamin with minerals  1 tablet Oral Daily  . terazosin  20 mg Oral QHS    Allergies: No Known Allergies  History   Social History  . Marital Status: Legally Separated    Spouse Name: N/A    Number of Children: N/A  . Years of Education: N/A   Occupational History  . Not on file.   Social History Main Topics  . Smoking status: Former Smoker -- 1.00 packs/day for 20 years    Types: Cigarettes    Quit date: 11/13/1972  . Smokeless tobacco: Never Used  . Alcohol Use: No  . Drug  Use: No  . Sexual Activity: Not on file   Other Topics Concern  . Not on file   Social History Narrative  . No narrative on file     Family History  Problem Relation Age of Onset  . Diabetes Mother   . Heart disease Mother   . Diabetes Father   . Heart disease Father     BP 146/88  Pulse 98  Temp(Src) 97.3 F (36.3 C) (Oral)  Resp 18  Ht 5\' 10"  (1.778 m)  Wt 262 lb 8 oz (119.069 kg)  BMI 37.66 kg/m2  SpO2 94%  Physical Exam: obese appearing NAD HEENT: Unremarkable,[place], AT Neck:  6 JVD, no thyromegally Back:  No CVA tenderness Lungs:  Clear with no wheezes, rales, or rhonchi HEART:  Regular rate rhythm, no murmurs, no rubs, no clicks Abd:  soft, positive bowel sounds, no organomegally, no rebound, no guarding Ext:  2 plus pulses, no edema, no cyanosis, no clubbing Skin:  No rashes no nodules Neuro:  CN II through XII intact, motor grossly intact   Labs:   Lab Results  Component Value Date   WBC 9.3 07/29/2014   HGB 11.2* 07/29/2014   HCT 33.9* 07/29/2014   MCV 86.0 07/29/2014   PLT 268 07/29/2014    Recent Labs Lab 07/30/14 0115  NA 138  K 4.6  CL 103  CO2 23  BUN 38*  CREATININE 1.89*  CALCIUM 9.5  GLUCOSE 172*    Radiology/Studies: Dg Chest 2 View 07/29/2014   CLINICAL DATA:  Chest pain  EXAM: CHEST  2 VIEW  COMPARISON:  November 14, 2012  FINDINGS: There is no edema or consolidation. The heart size and pulmonary vascularity are normal. No adenopathy. There is mid to lower thoracic dextroscoliosis.  IMPRESSION: No edema or consolidation.   Electronically Signed   By: Lowella Grip M.D.   On: 07/29/2014 10:19    AES:LPNPY rhythm, NSVT, normal intervals  TELEMETRY: sinus rhythm with frequent runs of NSVT  A/P 1. NSVT, with long episodes 2. HTN 3. DM Rec: I have discussed the treatment options with the patient. He has failed medical therapy. The risks/benefits/goals/expectations of EP study and catheter ablation of RVOT VT were discussed with the  patient and he wishes to proceed, schedule permitting.  Mikle Bosworth.D.

## 2014-07-31 NOTE — Progress Notes (Signed)
Subjective: No complaints, mild dizziness, several runs of NSVT. Cath 9/17 reassuring  Objective: Vital signs in last 24 hours: Temp:  [97.3 F (36.3 C)-98.5 F (36.9 C)] 97.3 F (36.3 C) (09/18 0520) Pulse Rate:  [70-100] 92 (09/18 0520) Resp:  [18] 18 (09/18 0520) BP: (91-143)/(63-91) 139/81 mmHg (09/18 0520) SpO2:  [94 %-98 %] 94 % (09/18 0520) Weight:  [262 lb 8 oz (119.069 kg)] 262 lb 8 oz (119.069 kg) (09/18 0520) Last BM Date: 07/29/14  Intake/Output from previous day: 09/17 0701 - 09/18 0700 In: -  Out: 3300 [Urine:3300] Intake/Output this shift: Total I/O In: -  Out: 350 [Urine:350]  Medications Current Facility-Administered Medications  Medication Dose Route Frequency Provider Last Rate Last Dose  . acetaminophen (TYLENOL) tablet 650 mg  650 mg Oral Q4H PRN Almyra Deforest, PA      . acetaminophen (TYLENOL) tablet 650 mg  650 mg Oral Q4H PRN Larey Dresser, MD      . amLODipine (NORVASC) tablet 10 mg  10 mg Oral Daily Almyra Deforest, [place]   10 mg at 07/30/14 1129  . aspirin EC tablet 81 mg  81 mg Oral Daily Almyra Deforest, [place]   81 mg at 07/30/14 1128  . atorvastatin (LIPITOR) tablet 20 mg  20 mg Oral QHS Jettie Booze, MD   20 mg at 07/30/14 2223  . fenofibrate tablet 54 mg  54 mg Oral Daily Tarri Fuller, PA-C   54 mg at 07/30/14 1128  . furosemide (LASIX) tablet 40 mg  40 mg Oral BID Tarri Fuller, PA-C      . heparin injection 5,000 Units  5,000 Units Subcutaneous 3 times per day Almyra Deforest, PA   5,000 Units at 07/31/14 [phone]  . insulin aspart (novoLOG) injection 0-20 Units  0-20 Units Subcutaneous TID WC Almyra Deforest, PA   3 Units at 07/30/14 [phone]  . insulin glargine (LANTUS) injection 48 Units  48 Units Subcutaneous BID Almyra Deforest, [place]   48 Units at 07/30/14 2223  . latanoprost (XALATAN) 0.005 % ophthalmic solution 1 drop  1 drop Both Eyes QHS Almyra Deforest, [place]   1 drop at 07/30/14 2223  . metoprolol (LOPRESSOR) tablet 50 mg  50 mg Oral BID Candee Furbish, MD      . multivitamin with  minerals tablet 1 tablet  1 tablet Oral Daily Almyra Deforest, [place]   1 tablet at 07/30/14 1128  . nitroGLYCERIN (NITROSTAT) SL tablet 0.4 mg  0.4 mg Sublingual Q5 Min x 3 PRN Almyra Deforest, PA      . ondansetron (ZOFRAN) injection 4 mg  4 mg Intravenous Q6H PRN Almyra Deforest, PA      . ondansetron (ZOFRAN) injection 4 mg  4 mg Intravenous Q6H PRN Larey Dresser, MD      . terazosin (HYTRIN) capsule 20 mg  20 mg Oral QHS Almyra Deforest, PA   20 mg at 07/30/14 2223    PE: General appearance: alert, cooperative and no distress Lungs: clear to auscultation bilaterally Heart: regular rate and rhythm and 1/6 sys MM Extremities: 1+ LEE L>R Pulses: 2+ and symmetric Skin: Warm and dry Neurologic: Grossly normal  Lab Results:   Recent Labs  07/29/14 1001 07/29/14 2021  WBC 7.8 9.3  HGB 10.4* 11.2*  HCT 31.6* 33.9*  PLT 231 268   BMET  Recent Labs  07/29/14 1001 07/29/14 2021 07/30/14 0115  NA 136*  --  138  K 3.6*  --  4.6  CL  100  --  103  CO2 22  --  23  GLUCOSE 220*  --  172*  BUN 40*  --  38*  CREATININE 1.82* 1.73* 1.89*  CALCIUM 9.7  --  9.5   PT/INR  Recent Labs  07/30/14 0812  LABPROT 14.0  INR 1.08   Cholesterol  Recent Labs  07/30/14 0115  CHOL 109   Lipid Panel     Component Value Date/Time   CHOL 109 07/30/2014 0115   TRIG 250* 07/30/2014 0115   HDL 27* 07/30/2014 0115   CHOLHDL 4.0 07/30/2014 0115   VLDL 50* 07/30/2014 0115   LDLCALC 32 07/30/2014 0115       Assessment/Plan  73 year old African American male with past medical history significant for hypertension, diabetes, obstructive sleep apnea, anxiety, hyperparathyroidism, chronic kidney disease and history of normocytic anemia presented with palpitation and was found to have NSVT (dizziness).   Active Problems:   Nonsustained paroxysmal ventricular tachycardia 1. 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 Left heart cath reassuring.  Lopressor increased to 50 bid started 9/16.  Still having a lot of short runs and PVCs.  Echo  normal EF 55%, mild LVH.  EP consult (spoke to [facility]). Still having VT despite beta blocker (dose just increased from 37 to 50). ? Change to Verap or ablation. Ate breakfast. Now NPO. K normal.  2. Hypertension   stable.   3. Diabetes   Lantus and SS, A1C 7.3 4. obstructive sleep apnea  5. Anxiety  6. Hyperparathyroidism  7. chronic kidney disease   SCr 1.73>>1.89.  Getting hydrated.   BNP was 104.8 8. history of normocytic anemia 9. Dyslipidemia  LDL 32 but TG 250 and HDL 27.  On lipitor.  Added fenofibrate renal dose.  Needs low carb diet. 10.  Obesity  Will recommend nutrition referral.   LE venous doppler-left: No evidence of DVT, superficial thrombosis, or Baker's cyst.     LOS: 2 days    Candee Furbish MD  07/31/2014 8:26 AM

## 2014-07-31 NOTE — CV Procedure (Signed)
Electrophysiology procedure note  Procedure: Electrophysiology study and catheter ablation of ventricular tachycardia utilizing electro-anatomic mapping  Preoperative diagnosis: Symptomatic long runs of nonsustained ventricular tachycardia, likely originating from the right ventricular outflow tract.  Postoperative diagnosis: Same as preoperative diagnoses, although can't exclude an aortic cusp focus  Description of the procedure: After informed consent was obtained, the patient was taken to the diagnostic electrophysiology laboratory in the fasting state. After the usual preparation and draping, intravenous Versed and fentanyl were used for sedation. A 6 French quadripolar catheter was inserted percutaneously into the right femoral vein and advanced to the right ventricle. A 6 French quadripolar catheter was inserted percutaneously into the right femoral vein and advanced to the His bundle region. Mapping was carried out. There were frequent PVCs initially, as well as runs of nonsustained ventricular tachycardia. The QRS morphology demonstrated a left bundle branch block pattern with inferior lead directed R waves in the inferior leads. The transition was positive in lead V3. A diagnosis of ventricular tachycardia originating from the right ventricular outflow tract was made. A 7 French quadripolar electro-anatomic mapping catheter was inserted percutaneously into the right femoral vein and advanced under fluoroscopic guidance to the right ventricle. That being of the patient's premature ventricular complexes was carried out. Initially, the patient was in bigeminy as well as trigeminy and had spontaneous occurring nonsustained ventricular tachycardia. 3-dimensional electroencephalogram mapping demonstrated the earliest activation to be in the anteroseptal region of the right ventricular outflow tract. 11 radiofrequency energy applications were delivered. The amount of PVCs decreased markedly. A pacemaker  match of 93% was demonstrated. The QRS complex was also seen. The local ventricular activation was approximately 75 ms pre-systolic. Additional rapid atrial and ventricular pacing was then carried out. The patient had very minimal persistent PVCs. There are also PVCs from other locations in both the right ventricle and the left ventricle during pacing following ablation. At this point I considered placing the ablation catheter in the cusp of the left ventricular outflow tract, for additional mapping. However access to the right femoral artery systemic circulation was difficult, and eventually the decision was made to abandon the procedure. The catheters were then removed, hemostasis was assured, and the patient was returned to his room in satisfactory condition.  Complications: There no immediate procedural complications.  Results. 1. Baseline ECG. The baseline ECG demonstrated sinus rhythm with frequent PVCs. The PVC morphology was left bundle branch block with a inferior axis directed leftward. 2. Baseline intervals. The sinus node cycle length was 722 ms. The QRS duration was 90 ms. The HV interval was 50 ms. The AH interval was 90 ms. 3. Rapid ventricular pacing rapid ventricular pacing prior to ablation demonstrated frequent nonsustained ventricular tachycardia and dense PVCs. Following ablation, rapid ventricular pacing demonstrated rare PVCs and no sustained or nonsustained ventricular tachycardia. 4. Programmed ventricular stimulation. Programmed ventricular stimulation was carried out from the right ventricle at a pacing cycle length of 500 ms. The S1-S2 interval was stepwise decrease down to 400 ms where the retrograde AV node ERP was observed. During programmed ventricular stimulation, the atrial activation was midline and decremental. 5. Programmed atrial stimulation. Programmed atrial stimulation was carried out from the atrium at a pacing cycle length of 500 ms. The S1-S2 interval was stepwise  decrease down to the AV node ERP. During programmed atrial stimulation, there were no AH jumps, no echo beats, and no inducible SVT. 6. Rapid atrial pacing. Rapid atrial pacing was carried out from the atrium at a pacing  cycle length of 600 ms, and stepwise decrease down to 380 ms or AV block was demonstrated. During rapid atrial pacing, the PR interval was less and the RR interval and there was no inducible SVT. However there was inducible nonsustained VT, prior to the ablation procedure. 7. Arrhythmias observed. Right ventricular outflow tract (resumed) ventricular tachycardia cycle length was 380 ms, duration nonsustained, morphology left bundle branch block, inferior axis 8. Mapping. Mapping of the patient's PVCs and nonsustained ventricular tachycardia demonstrated the earliest ventricular activation to occur utilizing three-dimensional electro-anatomic mapping in the region of the right ventricular outflow tract anteroseptum. 9. Radiofrequency energy application. Four radiofrequency energy applications were delivered. Following ablation, the density of the patient's PVCs was markedly reduced, and there was no additional nonsustained ventricular tachycardia.  Conclusion: Mostly successful catheter ablation of right ventricular outflow tract ventricular tachycardia with a total of 4 radiofrequency energy applications delivered to the anteroseptal region of the right ventricular outflow tract. Following the ablation procedure, there were rare PVCs still present both from the clinical PVC as well as other sites. We considered mapping the aortic root but because of difficulty with access in the femoral artery, decided to abandon additional mapping. It is anticipated that the patient will be treated with low-dose amiodarone for the first few months following catheter ablation.  [name], M.D.

## 2014-07-31 NOTE — Progress Notes (Signed)
Pt having several runs of NSVT. Pt stated he feels "ok" but "feels something in his chest." I notified Dr. Marlou Porch who came to see pt and increased dose of lopressor. I gave 50 mg as ordered and after about an hr, pt still having frequent NSVT episodes. BPs are charted in doc flowsheets and stable. I gave 5 of IV lopressor as ordered for PRN medications by Dr. Marlou Porch after getting clearance that it was ok to give it since PO dose administered a short while ago. EP consult ordered for pt. Pt kept NPO. Pads placed on pt for precautions. Will closely monitor

## 2014-07-31 NOTE — Progress Notes (Signed)
VASCULAR LAB PRELIMINARY  ARTERIAL  ABI completed:    RIGHT    LEFT    PRESSURE WAVEFORM  PRESSURE WAVEFORM  BRACHIAL 146 Triphasic BRACHIAL 153 Triphasic  DP 183 Triphasic DP 190 Triphasic  PT 185 Triphasic PT 197 Triphasic    RIGHT LEFT  ABI 1.21 1.29   ABIs and Doppler waveforms are normal bilaterally at rest.  [place], RVS 07/31/2014, 12:11 PM  Darlina Sicilian, RDCS  07/31/2014, 12:11 PM

## 2014-08-01 LAB — BASIC METABOLIC PANEL
Anion gap: 11 (ref 5–15)
BUN: 24 mg/dL — AB (ref 6–23)
CALCIUM: 9.9 mg/dL (ref 8.4–10.5)
CO2: 25 mEq/L (ref 19–32)
Chloride: 101 mEq/L (ref 96–112)
Creatinine, Ser: 1.5 mg/dL — ABNORMAL HIGH (ref 0.50–1.35)
GFR calc Af Amer: 52 mL/min — ABNORMAL LOW (ref >90)
GFR, EST NON AFRICAN AMERICAN: 45 mL/min — AB (ref >90)
Glucose, Bld: 128 mg/dL — ABNORMAL HIGH (ref 70–99)
Potassium: 4.2 mEq/L (ref 3.7–5.3)
Sodium: 137 mEq/L (ref 137–147)

## 2014-08-01 LAB — HEPATIC FUNCTION PANEL
ALBUMIN: 3 g/dL — AB (ref 3.5–5.2)
ALK PHOS: 60 U/L (ref 39–117)
ALT: 33 U/L (ref 0–53)
AST: 60 U/L — ABNORMAL HIGH (ref 0–37)
Bilirubin, Direct: <0.2 mg/dL (ref 0.0–0.3)
TOTAL PROTEIN: 7.5 g/dL (ref 6.0–8.3)
Total Bilirubin: 0.3 mg/dL (ref 0.3–1.2)

## 2014-08-01 LAB — CREATININE, SERUM
Creatinine, Ser: 1.38 mg/dL — ABNORMAL HIGH (ref 0.50–1.35)
GFR calc non Af Amer: 50 mL/min — ABNORMAL LOW (ref >90)
GFR, EST AFRICAN AMERICAN: 57 mL/min — AB (ref >90)

## 2014-08-01 LAB — GLUCOSE, CAPILLARY: GLUCOSE-CAPILLARY: 107 mg/dL — AB (ref 70–99)

## 2014-08-01 MED ORDER — AMIODARONE HCL 200 MG PO TABS
ORAL_TABLET | ORAL | Status: DC
Start: 2014-08-01 — End: 2014-09-03

## 2014-08-01 MED ORDER — ASPIRIN EC 81 MG PO TBEC: 325.0000 mg | DELAYED_RELEASE_TABLET | Freq: Every day | ORAL | Status: DC

## 2014-08-01 MED ORDER — ATORVASTATIN CALCIUM 20 MG PO TABS: 20.0000 mg | ORAL_TABLET | Freq: Every day | ORAL | Status: DC

## 2014-08-01 MED ORDER — METOPROLOL TARTRATE 50 MG PO TABS: 50.0000 mg | ORAL_TABLET | Freq: Two times a day (BID) | ORAL | Status: DC

## 2014-08-01 NOTE — Care Management Note (Signed)
Received call from patient's nurse stating rolling walker has been ordered for home use upon discharge. Call made to [facility] [employer] representative Brenton Grills), who states he received order and will be delivering walker to room shortly. Made nursing aware.[place], [place]

## 2014-08-01 NOTE — Discharge Summary (Signed)
ELECTROPHYSIOLOGY PROCEDURE DISCHARGE SUMMARY    Patient ID: [name] Wiley,  MRN: [id number], DOB/AGE: [birth year] 73 y.o.  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  Diabetes 2.  Hypertension 3.  Sleep apnea 4.  Hyperparathyroidism 5.  CKD 6.  Anxiety  No Known Allergies   Procedures This Admission:  1.  Cardiac catheterization on 07-30-14 by Dr Aundra Dubin demonstrated no angiographic CAD 2.  Echocardiogram on 07-30-14 demonstrated EF 55-60%, no RWMA, grade 2 diastolic dysfunction, mild to moderate TR, mild to moderate MR, mild pulmonary hypertension. 3.  Electrophysiology study and radiofrequency catheter ablation on 07-31-14 by Dr Lovena Le. This study demonstrated mostly successful catheter ablation of right ventricular outflow tract ventricular tachycardia with a total of 4 radiofrequency energy applications delivered to the anteroseptal region of the right ventricular outflow tract. Following the ablation procedure, there were rare PVCs still present both from the clinical PVC as well as other sites. We considered mapping the aortic root but because of difficulty with access in the femoral artery, decided to abandon additional mapping. It is anticipated that the patient will be treated with low-dose amiodarone for the first few months following catheter ablation.  Brief HPI/Hospital Course:  [name] Wiley is a 73 y.o. male with a past medical history significant for diabetes, hypertension, sleep apnea, hyperparathyroidism, chronic kidney disease, and anxiety. Over the last 3 weeks he has developed palpitations associated with dizziness and pre-sycnope that are increasing in duration and freqeuncy. On the day of admission, his symptoms lasted  longer than usual and he called EMS and was brought to [facility] for further evaluation. Catheterization this admission demonstrated normal coronaries. Echo this admission demonstrated EF 62-69%, grade 2 diastolic dysfunction, mild to moderate MR, mild to moderate TR, LA 41.  Telemetry demonstrated dense ventricular ectopy with frequent runs of NSVT.  He was evaluated by Dr Lovena Le with electrophysiology who recommended EPS and ablation of RVOT PVC's.  Risks, benefits, and alternatives were discussed with the patient who wished to proceed.  EPS and ablation were carried out on 07-31-14 with details as outlined above.  He was monitored on telemetry overnight which demonstrated sinus rhythm with PVC's but improved NSVT.  Plan is to discharge home on low dose amiodarone for several months to help with ventricular ectopy burden.  He was evaluated by Dr Lovena Le on 08-01-14 and considered stable for discharge to home.    Discharge Vitals: Blood pressure 115/77, pulse 74, temperature 98.5 F (36.9 C), temperature source Oral, resp. rate 16, height 5\' 10"  (1.778 m), weight 257 lb 12.8 oz (116.937 kg), SpO2 98.00%.   Labs:   Lab Results  Component Value Date   WBC 9.7 07/31/2014   HGB 11.7* 07/31/2014   HCT 35.5* 07/31/2014   MCV 85.3 07/31/2014   PLT 245 07/31/2014     Recent Labs Lab 08/01/14 0502  NA 137  K 4.2  CL 101  CO2 25  BUN 24*  CREATININE 1.50*  CALCIUM 9.9  GLUCOSE 128*     Discharge Medications:    Medication List    STOP taking these medications       simvastatin 40 MG tablet  Commonly known as:  ZOCOR      TAKE these medications       amiodarone 200 MG tablet  Commonly known  as:  PACERONE  2 tablets BID x 1 week, then 1 tablet BID     amLODipine-valsartan 10-320 MG per tablet  Commonly known as:  EXFORGE  Take 1 tablet by mouth daily before breakfast.     aspirin EC 81 MG tablet  Take 4 tablets (325 mg total) by mouth daily.     atorvastatin 20 MG tablet  Commonly  known as:  LIPITOR  Take 1 tablet (20 mg total) by mouth at bedtime.     chlorthalidone 25 MG tablet  Commonly known as:  HYGROTON  Take 25 mg by mouth daily before breakfast.     furosemide 40 MG tablet  Commonly known as:  LASIX  Take 40 mg by mouth 2 (two) times daily. 8am and 2 pm     insulin aspart 100 UNIT/ML injection  Commonly known as:  novoLOG  Inject into the skin 3 (three) times daily before meals. SLIDING SCALE     insulin glargine 100 UNIT/ML injection  Commonly known as:  LANTUS  Inject 48 Units into the skin 2 (two) times daily.     meclizine 12.5 MG tablet  Commonly known as:  ANTIVERT  Take 1 tablet (12.5 mg total) by mouth 3 (three) times daily as needed for dizziness.     metoprolol 50 MG tablet  Commonly known as:  LOPRESSOR  Take 1 tablet (50 mg total) by mouth 2 (two) times daily.     multivitamin with minerals Tabs tablet  Take 1 tablet by mouth daily.     terazosin 10 MG capsule  Commonly known as:  HYTRIN  Take 20 mg by mouth at bedtime.     XALATAN 0.005 % ophthalmic solution  Generic drug:  latanoprost  Place 1 drop into both eyes at bedtime.        Disposition:   Follow-up Information   Follow up with [name],[name], MD. (As needed, If symptoms worsen)    Specialty:  Internal Medicine      Follow up with [name], MD On 09/03/2014. (12:00 PM)    Specialty:  Cardiology   Contact information:   [address] [address] [phone]       Duration of Discharge Encounter: Greater than 30 minutes including physician time.  Signed,  Murray Hodgkins, NP 08/01/2014 8:18 AM   EP Attending  Patient seen and examined. Agree with the plan as noted by Mr. Sharolyn Douglas, NP. [place] for discharge home.  Mikle Bosworth.D.

## 2014-09-03 ENCOUNTER — Encounter: Payer: Self-pay | Admitting: Internal Medicine

## 2014-09-03 ENCOUNTER — Ambulatory Visit (INDEPENDENT_AMBULATORY_CARE_PROVIDER_SITE_OTHER): Payer: Medicare Other | Admitting: Internal Medicine

## 2014-09-03 VITALS — BP 98/58 | HR 63 | Ht 68.0 in | Wt 266.6 lb

## 2014-09-03 DIAGNOSIS — I4729 Other ventricular tachycardia: Secondary | ICD-10-CM

## 2014-09-03 DIAGNOSIS — I4891 Unspecified atrial fibrillation: Secondary | ICD-10-CM

## 2014-09-03 DIAGNOSIS — I472 Ventricular tachycardia: Secondary | ICD-10-CM

## 2014-09-03 DIAGNOSIS — R002 Palpitations: Secondary | ICD-10-CM

## 2014-09-03 DIAGNOSIS — I10 Essential (primary) hypertension: Secondary | ICD-10-CM | POA: Insufficient documentation

## 2014-09-03 MED ORDER — AMIODARONE HCL 200 MG PO TABS: 200.0000 mg | ORAL_TABLET | Freq: Every day | ORAL | Status: DC

## 2014-09-03 NOTE — Assessment & Plan Note (Signed)
His blood pressure is low today. We considered reducing his medications but he would like to wait and see Dr. Posey Pronto before reducing dose of anti-hypertensive meds.

## 2014-09-03 NOTE — Patient Instructions (Signed)
Your physician wants you to follow-up in: 3- 4 months with Dr Lovena Le   Your physician has recommended you make the following change in your medication:  1) Decrease Amiodarone to once daily

## 2014-09-03 NOTE — Assessment & Plan Note (Signed)
His symptoms are much improved. Will follow.

## 2014-09-03 NOTE — Assessment & Plan Note (Signed)
His symptoms are fairly well controlled. I have asked him to reduce amiodarone to 200 mg daily.

## 2014-09-03 NOTE — Progress Notes (Signed)
HPI [name] Wiley returns today for followup. 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 He underwent catheter ablation. I was not certain that we had fixed him completely though his PVC's resolved after ablation and he has been on amiodarone. He c/o fatigue and his blood pressure may be a little on the low side. He has not had syncope and has not felt symptomatic PVC's. He denies chest pain. His peripheral edema has improved.  No Known Allergies   Current Outpatient Prescriptions  Medication Sig Dispense Refill  . amiodarone (PACERONE) 200 MG tablet Take 1 tablet (200 mg total) by mouth daily.      Marland Kitchen amLODipine-valsartan (EXFORGE) 10-320 MG per tablet Take 1 tablet by mouth daily before breakfast.       . aspirin 325 MG tablet Take 650 mg by mouth daily.      Marland Kitchen atorvastatin (LIPITOR) 20 MG tablet Take 1 tablet (20 mg total) by mouth at bedtime.  30 tablet  6  . furosemide (LASIX) 40 MG tablet Take 40 mg by mouth 2 (two) times daily. 8am and 2 pm      . insulin aspart (NOVOLOG) 100 UNIT/ML injection Inject into the skin 3 (three) times daily before meals. SLIDING SCALE      . insulin glargine (LANTUS) 100 UNIT/ML injection Inject 48 Units into the skin 2 (two) times daily.       Marland Kitchen latanoprost (XALATAN) 0.005 % ophthalmic solution Place 1 drop into both eyes at bedtime.       . metoprolol (LOPRESSOR) 50 MG tablet Take 1 tablet (50 mg total) by mouth 2 (two) times daily.  60 tablet  6  . Multiple Vitamin (MULTIVITAMIN WITH MINERALS) TABS tablet Take 1 tablet by mouth daily.      Marland Kitchen terazosin (HYTRIN) 10 MG capsule Take 20 mg by mouth at bedtime.       No current facility-administered medications for this visit.     Past Medical History  Diagnosis Date  . Hypertension   . Diabetes mellitus without complication   . Sleep apnea, obstructive   . Osteoporosis   . Hyperparathyroidism   . Anxiety   . Palpitations     ROS:   All systems reviewed  and negative except as noted in the HPI.   Past Surgical History  Procedure Laterality Date  . Hernia repair      Lt ing hernia  . Back surgery      lower back  . Parathyroidectomy  11/22/2012    Procedure: PARATHYROIDECTOMY;  Surgeon: Earnstine Regal, MD;  Location: [facility];  Service: General;  Laterality: N/A;  Parathyroidectomy     Family History  Problem Relation Age of Onset  . Diabetes Mother   . Heart disease Mother   . Diabetes Father   . Heart disease Father      History   Social History  . Marital Status: Legally Separated    Spouse Name: N/A    Number of Children: N/A  . Years of Education: N/A   Occupational History  . Not on file.   Social History Main Topics  . Smoking status: Former Smoker -- 1.00 packs/day for 20 years    Types: Cigarettes    Quit date: 11/13/1972  . Smokeless tobacco: Never Used  . Alcohol Use: No  . Drug Use: No  . Sexual Activity: Not on file   Other Topics Concern  .  Not on file   Social History Narrative  . No narrative on file     BP 98/58  Pulse 63  Ht 5\' 8"  (1.727 m)  Wt 266 lb 9.6 oz (120.929 kg)  BMI 40.55 kg/m2  Physical Exam:  Chronically ill appearing 73 yo man, NAD HEENT: Unremarkable Neck:  No JVD, no thyromegally Lymphatics:  No adenopathy Back:  No CVA tenderness Lungs:  Clear except for basilar rales HEART:  Regular rate rhythm, no murmurs, no rubs, no clicks Abd:  soft, positive bowel sounds, no organomegally, no rebound, no guarding Ext:  2 plus pulses, no edema, no cyanosis, no clubbing Skin:  No rashes no nodules Neuro:  CN II through XII intact, motor grossly intact  EKG - nsr with PVC's with negative to positive transition is V2 to V3.   Assess/Plan:

## 2014-10-22 ENCOUNTER — Encounter (HOSPITAL_COMMUNITY): Payer: Self-pay | Admitting: Cardiology

## 2014-11-19 IMAGING — CR DG CHEST 2V
2 series · 2 of 2 positions shown · non-contrast
Comparison: November 14, 2012

CLINICAL DATA: Chest pain

EXAM:
CHEST  2 VIEW

[w chest pa]
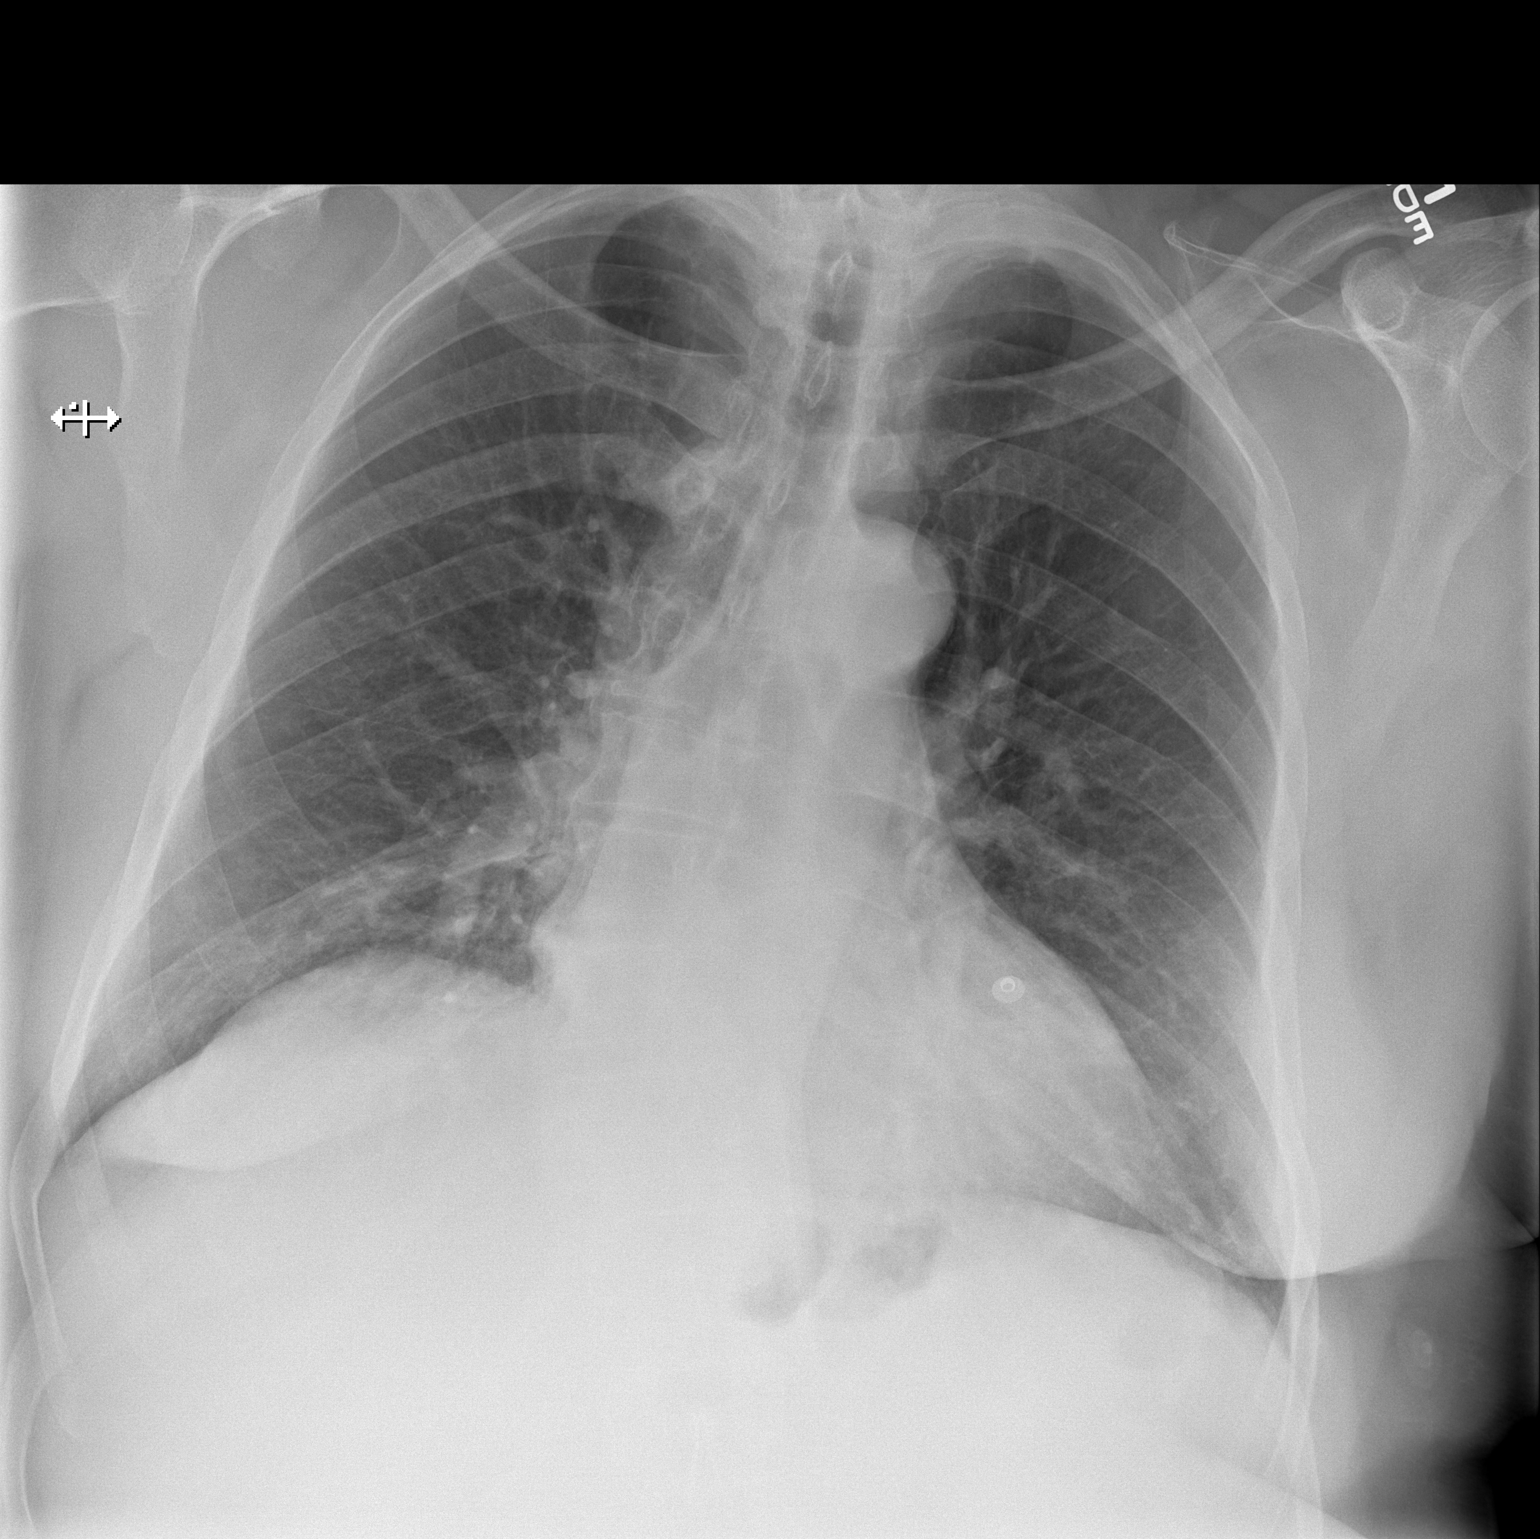

[w chest lat]
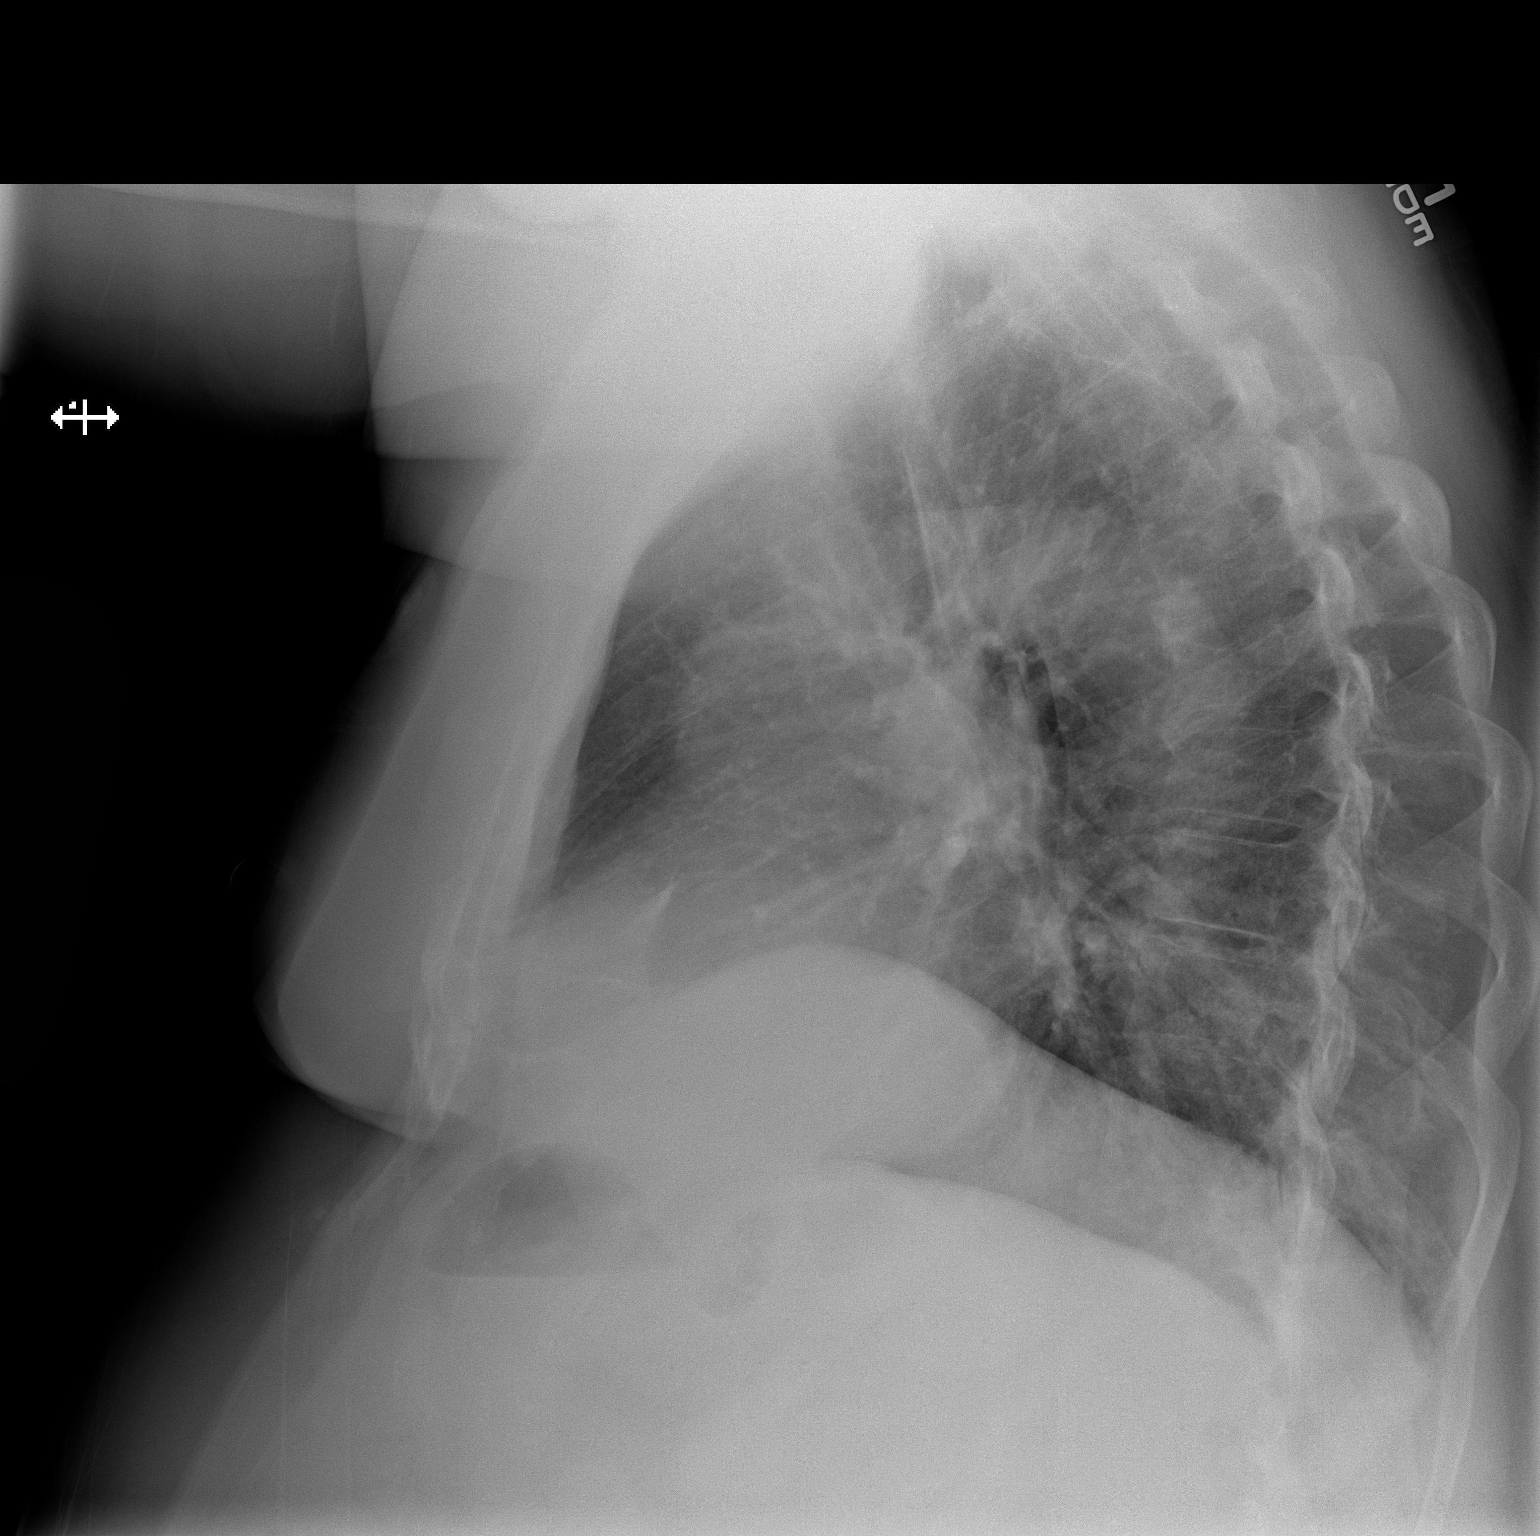

[2 of 2 positions shown; findings below may reference images not displayed]

FINDINGS: There is no edema or consolidation. The heart size and pulmonary
vascularity are normal. No adenopathy. There is mid to lower
thoracic dextroscoliosis.
IMPRESSION: No edema or consolidation.

## 2014-12-08 ENCOUNTER — Ambulatory Visit (INDEPENDENT_AMBULATORY_CARE_PROVIDER_SITE_OTHER): Payer: Medicare Other | Admitting: Internal Medicine

## 2014-12-08 ENCOUNTER — Encounter: Payer: Self-pay | Admitting: Internal Medicine

## 2014-12-08 VITALS — BP 96/60 | HR 72 | Ht 69.5 in | Wt 276.4 lb

## 2014-12-08 DIAGNOSIS — I10 Essential (primary) hypertension: Secondary | ICD-10-CM

## 2014-12-08 DIAGNOSIS — R002 Palpitations: Secondary | ICD-10-CM

## 2014-12-08 DIAGNOSIS — I4729 Other ventricular tachycardia: Secondary | ICD-10-CM

## 2014-12-08 DIAGNOSIS — I5032 Chronic diastolic (congestive) heart failure: Secondary | ICD-10-CM

## 2014-12-08 DIAGNOSIS — I472 Ventricular tachycardia: Secondary | ICD-10-CM

## 2014-12-08 MED ORDER — METOPROLOL TARTRATE 50 MG PO TABS: 25.0000 mg | ORAL_TABLET | Freq: Two times a day (BID) | ORAL | Status: AC

## 2014-12-08 NOTE — Assessment & Plan Note (Signed)
His symptoms are minimal presently and he will continue his low dose of amio.

## 2014-12-08 NOTE — Progress Notes (Signed)
HPI [name] returns today for followup. 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 He underwent catheter ablation.  He has been on amiodarone. He c/o fatigue and his blood pressure may be a little on the low side. He has not had syncope and has not felt symptomatic PVC's. He denies chest pain. His peripheral edema has improved. He does have some nocturnal wheezing. He admits to sodium indiscretion.  No Known Allergies   Current Outpatient Prescriptions  Medication Sig Dispense Refill  . amiodarone (PACERONE) 200 MG tablet Take 1 tablet (200 mg total) by mouth daily.    Marland Kitchen amLODipine-valsartan (EXFORGE) 10-320 MG per tablet Take 1 tablet by mouth daily before breakfast.     . aspirin 325 MG tablet Take 650 mg by mouth daily.    Marland Kitchen atorvastatin (LIPITOR) 20 MG tablet Take 1 tablet (20 mg total) by mouth at bedtime. 30 tablet 6  . furosemide (LASIX) 40 MG tablet Take 40 mg by mouth 2 (two) times daily. 8am and 2 pm    . insulin aspart (NOVOLOG) 100 UNIT/ML injection Inject into the skin 3 (three) times daily before meals. SLIDING SCALE    . insulin glargine (LANTUS) 100 UNIT/ML injection Inject 48 Units into the skin 2 (two) times daily.     Marland Kitchen latanoprost (XALATAN) 0.005 % ophthalmic solution Place 1 drop into both eyes at bedtime.     . metoprolol (LOPRESSOR) 50 MG tablet Take 1 tablet (50 mg total) by mouth 2 (two) times daily. 60 tablet 6  . Multiple Vitamin (MULTIVITAMIN WITH MINERALS) TABS tablet Take 1 tablet by mouth daily.    Marland Kitchen terazosin (HYTRIN) 10 MG capsule Take 20 mg by mouth at bedtime.     No current facility-administered medications for this visit.     Past Medical History  Diagnosis Date  . Hypertension   . Diabetes mellitus without complication   . Sleep apnea, obstructive   . Osteoporosis   . Hyperparathyroidism   . Anxiety   . Palpitations     ROS:   All systems reviewed and negative except as noted in the  HPI.   Past Surgical History  Procedure Laterality Date  . Hernia repair      Lt ing hernia  . Back surgery      lower back  . Parathyroidectomy  11/22/2012    Procedure: PARATHYROIDECTOMY;  Surgeon: Earnstine Regal, MD;  Location: [facility];  Service: General;  Laterality: N/A;  Parathyroidectomy  . Left heart catheterization with coronary angiogram N/A 07/30/2014    Procedure: LEFT HEART CATHETERIZATION WITH CORONARY ANGIOGRAM;  Surgeon: Larey Dresser, MD;  Location: [facility] CATH LAB;  Service: Cardiovascular;  Laterality: N/A;  . V-tach ablation N/A 07/31/2014    Procedure: V-TACH ABLATION;  Surgeon: Evans Lance, MD;  Location: [facility] CATH LAB;  Service: Cardiovascular;  Laterality: N/A;     Family History  Problem Relation Age of Onset  . Diabetes Mother   . Heart disease Mother   . Diabetes Father   . Heart disease Father      History   Social History  . Marital Status: Legally Separated    Spouse Name: N/A    Number of Children: N/A  . Years of Education: N/A   Occupational History  . Not on file.   Social History Main Topics  . Smoking status: Former Smoker -- 1.00 packs/day for 20 years  Types: Cigarettes    Quit date: 11/13/1972  . Smokeless tobacco: Never Used  . Alcohol Use: No  . Drug Use: No  . Sexual Activity: Not on file   Other Topics Concern  . Not on file   Social History Narrative     BP 96/60 mmHg  Pulse 72  Ht 5' 9.5" (1.765 m)  Wt 276 lb 6.4 oz (125.374 kg)  BMI 40.25 kg/m2  Physical Exam:  Chronically ill appearing 74 yo man, NAD HEENT: Unremarkable Neck:  7 cm JVD, no thyromegally Back:  No CVA tenderness Lungs:  Clear except for basilar rales HEART:  Regular rate rhythm, no murmurs, no rubs, no clicks Abd:  soft, positive bowel sounds, no organomegally, no rebound, no guarding Ext:  2 plus pulses, no edema, no cyanosis, no clubbing Skin:  No rashes no nodules Neuro:  CN II through XII intact, motor grossly intact  EKG - nsr with  PVC's with negative to positive transition is V2 to V3.   Assess/Plan:

## 2014-12-08 NOTE — Assessment & Plan Note (Signed)
His symptoms appear to be controlled on low dose amiodarone. I have asked that he continue his current meds.

## 2014-12-08 NOTE — Patient Instructions (Signed)
Your physician recommends that you schedule a follow-up appointment in: 3 months with Dr. Lovena Le   Your physician has recommended you make the following change in your medication:  1) Decrease Metoprolol to 25mg  twice daily

## 2014-12-08 NOTE — Assessment & Plan Note (Signed)
His blood pressure remains low. I have asked him to reduce his dose of metoprolol to 25 bid.

## 2014-12-08 NOTE — Assessment & Plan Note (Signed)
He has class 2 dyspnea. He will continue his current meds. He will maintain a low sodium diet.

## 2015-02-17 ENCOUNTER — Encounter: Payer: Self-pay | Admitting: Internal Medicine

## 2015-02-17 ENCOUNTER — Ambulatory Visit (INDEPENDENT_AMBULATORY_CARE_PROVIDER_SITE_OTHER): Payer: Medicare Other | Admitting: Internal Medicine

## 2015-02-17 VITALS — BP 126/60 | HR 79 | Ht 70.0 in | Wt 277.6 lb

## 2015-02-17 DIAGNOSIS — N5201 Erectile dysfunction due to arterial insufficiency: Secondary | ICD-10-CM | POA: Diagnosis not present

## 2015-02-17 DIAGNOSIS — I10 Essential (primary) hypertension: Secondary | ICD-10-CM | POA: Diagnosis not present

## 2015-02-17 DIAGNOSIS — Z79899 Other long term (current) drug therapy: Secondary | ICD-10-CM

## 2015-02-17 DIAGNOSIS — I4729 Other ventricular tachycardia: Secondary | ICD-10-CM

## 2015-02-17 DIAGNOSIS — I472 Ventricular tachycardia: Secondary | ICD-10-CM | POA: Diagnosis not present

## 2015-02-17 DIAGNOSIS — R002 Palpitations: Secondary | ICD-10-CM

## 2015-02-17 DIAGNOSIS — N529 Male erectile dysfunction, unspecified: Secondary | ICD-10-CM | POA: Insufficient documentation

## 2015-02-17 LAB — HEPATIC FUNCTION PANEL
ALBUMIN: 3.4 g/dL — AB (ref 3.5–5.2)
ALT: 37 U/L (ref 0–53)
AST: 32 U/L (ref 0–37)
Alkaline Phosphatase: 63 U/L (ref 39–117)
BILIRUBIN DIRECT: 0.1 mg/dL (ref 0.0–0.3)
TOTAL PROTEIN: 7.7 g/dL (ref 6.0–8.3)
Total Bilirubin: 0.2 mg/dL (ref 0.2–1.2)

## 2015-02-17 LAB — TSH: TSH: 1.41 u[IU]/mL (ref 0.35–4.50)

## 2015-02-17 NOTE — Assessment & Plan Note (Signed)
At this point I think he would be safe to use Viagra or Cialis as needed for erectile dysfunction. I have cautioned him not to use any medications containing nitroglycerin.

## 2015-02-17 NOTE — Assessment & Plan Note (Signed)
It appears that the patient's symptomatic PVCs have improved as his symptoms are better and on exam today I counted over 100 heart beats and no ectopic beats. He will continue his current dose of amiodarone. We'll check labs.

## 2015-02-17 NOTE — Assessment & Plan Note (Signed)
His blood pressure is well controlled. He'll continue his current medications.

## 2015-02-17 NOTE — Progress Notes (Signed)
HPI Mr. Gift returns today for followup. 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 He underwent catheter ablation.  He has been on amiodarone.  He has not had syncope and has not felt symptomatic PVC's. He denies chest pain. His peripheral edema has improved. He admits to sodium indiscretion. He has had problems with ED and would like to use viagra.  No Known Allergies   Current Outpatient Prescriptions  Medication Sig Dispense Refill  . amiodarone (PACERONE) 200 MG tablet Take 1 tablet (200 mg total) by mouth daily.    Marland Kitchen amLODipine-valsartan (EXFORGE) 10-320 MG per tablet Take 1 tablet by mouth daily before breakfast.     . aspirin 325 MG tablet Take 650 mg by mouth daily.    Marland Kitchen atorvastatin (LIPITOR) 20 MG tablet Take 1 tablet (20 mg total) by mouth at bedtime. 30 tablet 6  . furosemide (LASIX) 40 MG tablet Take 40 mg by mouth 2 (two) times daily. 8am and 2 pm    . insulin aspart (NOVOLOG) 100 UNIT/ML injection Inject into the skin 3 (three) times daily before meals. SLIDING SCALE    . insulin glargine (LANTUS) 100 UNIT/ML injection Inject 48 Units into the skin 2 (two) times daily.     Marland Kitchen latanoprost (XALATAN) 0.005 % ophthalmic solution Place 1 drop into both eyes at bedtime.     . metoprolol (LOPRESSOR) 50 MG tablet Take 0.5 tablets (25 mg total) by mouth 2 (two) times daily. 60 tablet 6  . Multiple Vitamin (MULTIVITAMIN WITH MINERALS) TABS tablet Take 1 tablet by mouth daily.    Marland Kitchen terazosin (HYTRIN) 10 MG capsule Take 20 mg by mouth at bedtime.     No current facility-administered medications for this visit.     Past Medical History  Diagnosis Date  . Hypertension   . Diabetes mellitus without complication   . Sleep apnea, obstructive   . Osteoporosis   . Hyperparathyroidism   . Anxiety   . Palpitations     ROS:   All systems reviewed and negative except as noted in the HPI.   Past Surgical History  Procedure Laterality  Date  . Hernia repair      Lt ing hernia  . Back surgery      lower back  . Parathyroidectomy  11/22/2012    Procedure: PARATHYROIDECTOMY;  Surgeon: Earnstine Regal, MD;  Location: [facility];  Service: General;  Laterality: N/A;  Parathyroidectomy  . Left heart catheterization with coronary angiogram N/A 07/30/2014    Procedure: LEFT HEART CATHETERIZATION WITH CORONARY ANGIOGRAM;  Surgeon: Larey Dresser, MD;  Location: [facility] CATH LAB;  Service: Cardiovascular;  Laterality: N/A;  . V-tach ablation N/A 07/31/2014    Procedure: V-TACH ABLATION;  Surgeon: Evans Lance, MD;  Location: [facility] CATH LAB;  Service: Cardiovascular;  Laterality: N/A;     Family History  Problem Relation Age of Onset  . Diabetes Mother   . Heart disease Mother   . Diabetes Father   . Heart disease Father      History   Social History  . Marital Status: Legally Separated    Spouse Name: N/A  . Number of Children: N/A  . Years of Education: N/A   Occupational History  . Not on file.   Social History Main Topics  . Smoking status: Former Smoker -- 1.00 packs/day for 20 years    Types: Cigarettes    Quit date: 11/13/1972  .  Smokeless tobacco: Never Used  . Alcohol Use: No  . Drug Use: No  . Sexual Activity: Not on file   Other Topics Concern  . Not on file   Social History Narrative     BP 126/60 mmHg  Pulse 79  Ht 5\' 10"  (1.778 m)  Wt 277 lb 9.6 oz (125.919 kg)  BMI 39.83 kg/m2  Physical Exam:  Chronically ill appearing 74 yo man, NAD HEENT: Unremarkable Neck:  7 cm JVD, no thyromegally Back:  No CVA tenderness Lungs:  Clear except for basilar rales HEART:  Regular rate rhythm, no murmurs, no rubs, no clicks Abd:  soft, positive bowel sounds, no organomegally, no rebound, no guarding Ext:  2 plus pulses, left leg with 2+ edema, right leg with 1+ edema, no cyanosis, no clubbing Skin:  No rashes no nodules Neuro:  CN II through XII intact, motor grossly intact    Assess/Plan:

## 2015-02-17 NOTE — Assessment & Plan Note (Signed)
His symptoms are much improved. He will continue his current medications.

## 2015-02-17 NOTE — Patient Instructions (Signed)
Your physician recommends that you continue on your current medications as directed. Please refer to the Current Medication list given to you today.  Labs today: TSH, LFT  Your physician wants you to follow-up in: 6 months with Dr. Lovena Le.  You will receive a reminder letter in the mail two months in advance. If you don't receive a letter, please call our office to schedule the follow-up appointment.

## 2015-04-02 ENCOUNTER — Telehealth: Payer: Self-pay | Admitting: Internal Medicine

## 2015-04-02 NOTE — Telephone Encounter (Signed)
Had an episode a weak ago where he felt he was "going to fall out" went to the ER and was told her was dehydrated and saw his PCP yesterday and Dr. Legrand Como wanted Dr Tanna Furry opinion as to what to do.  He denies heart racing just says he feels unsteady at times.  He was instructed to increase his water intake  He is now taking in 24 ounces but i have asked him to increase that and I would discuss with Dr Lovena Le next week and call him back

## 2015-04-02 NOTE — Telephone Encounter (Signed)
New Prob   Pt is following up on a message that was sent over from PCP regarding a plan for pts heart rhythm. Please call.

## 2015-04-08 NOTE — Telephone Encounter (Signed)
Spoke with patient and he feels good  No more episodes since he has been hydrating.  He will call me back if he has any more episodes and I will get him in to be seen

## 2015-06-28 ENCOUNTER — Other Ambulatory Visit: Payer: Self-pay

## 2015-06-28 DIAGNOSIS — I4891 Unspecified atrial fibrillation: Secondary | ICD-10-CM

## 2015-06-28 MED ORDER — AMIODARONE HCL 200 MG PO TABS: 200.0000 mg | ORAL_TABLET | Freq: Every day | ORAL | Status: DC

## 2015-09-20 ENCOUNTER — Encounter: Payer: Self-pay | Admitting: Internal Medicine

## 2015-09-20 ENCOUNTER — Ambulatory Visit (INDEPENDENT_AMBULATORY_CARE_PROVIDER_SITE_OTHER): Payer: Medicare Other | Admitting: Internal Medicine

## 2015-09-20 VITALS — BP 118/70 | HR 75 | Ht 70.0 in | Wt 284.6 lb

## 2015-09-20 DIAGNOSIS — I472 Ventricular tachycardia: Secondary | ICD-10-CM

## 2015-09-20 DIAGNOSIS — I4729 Other ventricular tachycardia: Secondary | ICD-10-CM

## 2015-09-20 DIAGNOSIS — I10 Essential (primary) hypertension: Secondary | ICD-10-CM | POA: Diagnosis not present

## 2015-09-20 DIAGNOSIS — I5032 Chronic diastolic (congestive) heart failure: Secondary | ICD-10-CM | POA: Diagnosis not present

## 2015-09-20 DIAGNOSIS — Z79899 Other long term (current) drug therapy: Secondary | ICD-10-CM

## 2015-09-20 DIAGNOSIS — E785 Hyperlipidemia, unspecified: Secondary | ICD-10-CM

## 2015-09-20 MED ORDER — ATORVASTATIN CALCIUM 20 MG PO TABS: 10.0000 mg | ORAL_TABLET | Freq: Every day | ORAL | Status: AC

## 2015-09-20 NOTE — Patient Instructions (Addendum)
Medication Instructions:  Your physician has recommended you make the following change in your medication: 1) STOP Simvastatin 2) DECREASE Lipitor to 1/2 tablet daily (10 mg total)   Labwork: Medication surveillance labs today: TSH & CMET  Testing/Procedures: None ordered  Follow-Up: Your physician wants you to follow-up in: 1 year with Dr. Lovena Le.  You will receive a reminder letter in the mail two months in advance. If you don't receive a letter, please call our office to schedule the follow-up appointment.   Any Other Special Instructions Will Be Listed Below (If Applicable).  If you need a refill on your cardiac medications before your next appointment, please call your pharmacy.  Thank you for choosing [facility]!!

## 2015-09-20 NOTE — Assessment & Plan Note (Signed)
His symptoms are class 2. He will continue his current meds. He is encouraged to lose weight and maintain a low sodium diet.

## 2015-09-20 NOTE — Assessment & Plan Note (Signed)
He appears to be symptomatic. I cannot say for certain that his syncope was not VT but he had previously felt palpitations with his ventricular ectopy so will follow. He will continue low dose amiodarone.

## 2015-09-20 NOTE — Progress Notes (Signed)
HPI [name] Wiley returns today for followup. 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 He underwent catheter ablation.  He has been on amiodarone.  He has had an episode of syncope, thought to be due to dehydration. He has not felt symptomatic PVC's. He denies chest pain. His peripheral edema has improved. He admits to sodium indiscretion. He has had problems with ED and would like to use viagra. He admits to dietary indiscretion.  No Known Allergies   Current Outpatient Prescriptions  Medication Sig Dispense Refill  . amiodarone (PACERONE) 200 MG tablet Take 1 tablet (200 mg total) by mouth daily. 30 tablet 11  . amLODipine-valsartan (EXFORGE) 10-320 MG per tablet Take 1 tablet by mouth daily before breakfast.     . aspirin 325 MG tablet Take 650 mg by mouth daily.    Marland Kitchen atorvastatin (LIPITOR) 20 MG tablet Take 1 tablet (20 mg total) by mouth at bedtime. 30 tablet 6  . cholecalciferol (VITAMIN D) 1000 UNITS tablet Take 1,000 Units by mouth daily.    . furosemide (LASIX) 40 MG tablet Take 40 mg by mouth 2 (two) times daily. 8am and 2 pm    . insulin aspart (NOVOLOG) 100 UNIT/ML injection Inject into the skin 3 (three) times daily before meals. SLIDING SCALE    . insulin glargine (LANTUS) 100 UNIT/ML injection Inject 48 Units into the skin 2 (two) times daily.     Marland Kitchen latanoprost (XALATAN) 0.005 % ophthalmic solution Place 1 drop into both eyes at bedtime.     . metoprolol (LOPRESSOR) 50 MG tablet Take 0.5 tablets (25 mg total) by mouth 2 (two) times daily. 60 tablet 6  . Multiple Vitamin (MULTIVITAMIN WITH MINERALS) TABS tablet Take 1 tablet by mouth daily.    . simvastatin (ZOCOR) 20 MG tablet Take by mouth daily.    Marland Kitchen terazosin (HYTRIN) 10 MG capsule Take 20 mg by mouth at bedtime.     No current facility-administered medications for this visit.     Past Medical History  Diagnosis Date  . Hypertension   . Diabetes mellitus without  complication ([place])   . Sleep apnea, obstructive   . Osteoporosis   . Hyperparathyroidism ([place])   . Anxiety   . Palpitations     ROS:   All systems reviewed and negative except as noted in the HPI.   Past Surgical History  Procedure Laterality Date  . Hernia repair      Lt ing hernia  . Back surgery      lower back  . Parathyroidectomy  11/22/2012    Procedure: PARATHYROIDECTOMY;  Surgeon: Earnstine Regal, MD;  Location: [facility];  Service: General;  Laterality: N/A;  Parathyroidectomy  . Left heart catheterization with coronary angiogram N/A 07/30/2014    Procedure: LEFT HEART CATHETERIZATION WITH CORONARY ANGIOGRAM;  Surgeon: Larey Dresser, MD;  Location: [facility] CATH LAB;  Service: Cardiovascular;  Laterality: N/A;  . V-tach ablation N/A 07/31/2014    Procedure: V-TACH ABLATION;  Surgeon: Evans Lance, MD;  Location: [facility] CATH LAB;  Service: Cardiovascular;  Laterality: N/A;     Family History  Problem Relation Age of Onset  . Diabetes Mother   . Heart disease Mother   . Diabetes Father   . Heart disease Father      Social History   Social History  . Marital Status: Legally Separated    Spouse Name: N/A  . Number of  Children: N/A  . Years of Education: N/A   Occupational History  . Not on file.   Social History Main Topics  . Smoking status: Former Smoker -- 1.00 packs/day for 20 years    Types: Cigarettes    Quit date: 11/13/1972  . Smokeless tobacco: Never Used  . Alcohol Use: No  . Drug Use: No  . Sexual Activity: Not on file   Other Topics Concern  . Not on file   Social History Narrative     BP 118/70 mmHg  Pulse 75  Ht 5\' 10"  (1.778 m)  Wt 284 lb 9.6 oz (129.094 kg)  BMI 40.84 kg/m2  Physical Exam:  Chronically ill appearing obese, 74 yo man, NAD HEENT: Unremarkable Neck:  7 cm JVD, no thyromegally Back:  No CVA tenderness Lungs:  Clear except for basilar rales HEART:  Regular rate rhythm, no murmurs, no rubs, no clicks Abd:  soft, positive  bowel sounds, no organomegally, no rebound, no guarding Ext:  2 plus pulses, left leg with 2+ edema, right leg with 1+ edema, no cyanosis, no clubbing Skin:  No rashes no nodules Neuro:  CN II through XII intact, motor grossly intact  ECG - NSR with nonspecific T wave abnormality  Assess/Plan:

## 2015-09-20 NOTE — Assessment & Plan Note (Signed)
His blood pressure is well controlled today. He is encouraged to reduce his salt intake.

## 2015-09-20 NOTE — Assessment & Plan Note (Signed)
He will stop his Zocor and reduce the dose of his lipitor as he is on amiodarone.

## 2015-09-27 ENCOUNTER — Other Ambulatory Visit (INDEPENDENT_AMBULATORY_CARE_PROVIDER_SITE_OTHER): Payer: Medicare Other | Admitting: *Deleted

## 2015-09-27 DIAGNOSIS — Z79899 Other long term (current) drug therapy: Secondary | ICD-10-CM | POA: Diagnosis not present

## 2015-09-27 LAB — COMPREHENSIVE METABOLIC PANEL
ALT: 47 U/L — ABNORMAL HIGH (ref 9–46)
AST: 37 U/L — ABNORMAL HIGH (ref 10–35)
Albumin: 3.4 g/dL — ABNORMAL LOW (ref 3.6–5.1)
Alkaline Phosphatase: 64 U/L (ref 40–115)
BUN: 22 mg/dL (ref 7–25)
CHLORIDE: 106 mmol/L (ref 98–110)
CO2: 21 mmol/L (ref 20–31)
Calcium: 9.6 mg/dL (ref 8.6–10.3)
Creat: 1.64 mg/dL — ABNORMAL HIGH (ref 0.70–1.18)
GLUCOSE: 132 mg/dL — AB (ref 65–99)
Potassium: 4.3 mmol/L (ref 3.5–5.3)
SODIUM: 139 mmol/L (ref 135–146)
TOTAL PROTEIN: 7.2 g/dL (ref 6.1–8.1)
Total Bilirubin: 0.2 mg/dL (ref 0.2–1.2)

## 2015-09-27 LAB — TSH: TSH: 1.159 u[IU]/mL (ref 0.350–4.500)

## 2016-05-03 ENCOUNTER — Other Ambulatory Visit: Payer: Self-pay | Admitting: Cardiovascular Disease

## 2016-10-09 ENCOUNTER — Encounter: Payer: Self-pay | Admitting: Internal Medicine

## 2016-10-15 ENCOUNTER — Other Ambulatory Visit: Payer: Self-pay | Admitting: Internal Medicine

## 2016-10-16 ENCOUNTER — Ambulatory Visit (INDEPENDENT_AMBULATORY_CARE_PROVIDER_SITE_OTHER): Payer: Medicare Other | Admitting: Internal Medicine

## 2016-10-16 ENCOUNTER — Encounter: Payer: Self-pay | Admitting: Internal Medicine

## 2016-10-16 VITALS — BP 110/64 | HR 69 | Ht 69.0 in | Wt 280.0 lb

## 2016-10-16 DIAGNOSIS — I472 Ventricular tachycardia: Secondary | ICD-10-CM

## 2016-10-16 DIAGNOSIS — I4729 Other ventricular tachycardia: Secondary | ICD-10-CM

## 2016-10-16 MED ORDER — AMIODARONE HCL 200 MG PO TABS: ORAL_TABLET | ORAL | 5 refills | Status: DC

## 2016-10-16 NOTE — Progress Notes (Signed)
HPI Mr. Goettl returns today for followup. 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 He underwent catheter ablation.  He has been on amiodarone.  He has had an episode of syncope, thought to be due to dehydration. He has not felt symptomatic PVC's. He denies chest pain. His peripheral edema has improved. He admits to sodium indiscretion. He has had problems with ED and would like to use viagra. He admits to dietary indiscretion.  No Known Allergies   Current Outpatient Prescriptions  Medication Sig Dispense Refill  . amiodarone (PACERONE) 200 MG tablet TAKE 1 TABLET (200 MG TOTAL) BY MOUTH DAILY. 30 tablet 5  . amLODipine-valsartan (EXFORGE) 10-320 MG per tablet Take 1 tablet by mouth daily before breakfast.     . aspirin 325 MG tablet Take 650 mg by mouth daily.    Marland Kitchen atorvastatin (LIPITOR) 20 MG tablet Take 0.5 tablets (10 mg total) by mouth at bedtime. 15 tablet 11  . cholecalciferol (VITAMIN D) 1000 UNITS tablet Take 1,000 Units by mouth daily.    . furosemide (LASIX) 40 MG tablet Take 40 mg by mouth 2 (two) times daily. 8am and 2 pm    . insulin aspart (NOVOLOG) 100 UNIT/ML injection Inject into the skin 3 (three) times daily before meals. SLIDING SCALE    . insulin glargine (LANTUS) 100 UNIT/ML injection Inject 48 Units into the skin 2 (two) times daily.     Marland Kitchen latanoprost (XALATAN) 0.005 % ophthalmic solution Place 1 drop into both eyes at bedtime.     . metoprolol (LOPRESSOR) 50 MG tablet Take 0.5 tablets (25 mg total) by mouth 2 (two) times daily. 60 tablet 6  . Multiple Vitamin (MULTIVITAMIN WITH MINERALS) TABS tablet Take 1 tablet by mouth daily.    Marland Kitchen terazosin (HYTRIN) 10 MG capsule Take 20 mg by mouth at bedtime.     No current facility-administered medications for this visit.      Past Medical History:  Diagnosis Date  . Anxiety   . Diabetes mellitus without complication ([place])   . Hyperparathyroidism ([place])   . Hypertension     . Osteoporosis   . Palpitations   . Sleep apnea, obstructive     ROS:   All systems reviewed and negative except as noted in the HPI.   Past Surgical History:  Procedure Laterality Date  . BACK SURGERY     lower back  . HERNIA REPAIR     Lt ing hernia  . LEFT HEART CATHETERIZATION WITH CORONARY ANGIOGRAM N/A 07/30/2014   Procedure: LEFT HEART CATHETERIZATION WITH CORONARY ANGIOGRAM;  Surgeon: Larey Dresser, MD;  Location: [facility] CATH LAB;  Service: Cardiovascular;  Laterality: N/A;  . PARATHYROIDECTOMY  11/22/2012   Procedure: PARATHYROIDECTOMY;  Surgeon: Earnstine Regal, MD;  Location: [facility] ORS;  Service: General;  Laterality: N/A;  Parathyroidectomy  . V-TACH ABLATION N/A 07/31/2014   Procedure: V-TACH ABLATION;  Surgeon: Evans Lance, MD;  Location: [facility] CATH LAB;  Service: Cardiovascular;  Laterality: N/A;     Family History  Problem Relation Age of Onset  . Diabetes Mother   . Heart disease Mother   . Diabetes Father   . Heart disease Father      Social History   Social History  . Marital status: Legally Separated    Spouse name: N/A  . Number of children: N/A  . Years of education: N/A   Occupational History  . Not on  file.   Social History Main Topics  . Smoking status: Former Smoker    Packs/day: 1.00    Years: 20.00    Types: Cigarettes    Quit date: 11/13/1972  . Smokeless tobacco: Never Used  . Alcohol use No  . Drug use: No  . Sexual activity: Not on file   Other Topics Concern  . Not on file   Social History Narrative  . No narrative on file     BP 110/64   Pulse 69   Ht 5\' 9"  (1.753 m)   Wt 280 lb (127 kg)   SpO2 98%   BMI 41.35 kg/m   Physical Exam:  Chronically ill appearing obese, 75 yo man, NAD HEENT: Unremarkable Neck:  7 cm JVD, no thyromegally Back:  No CVA tenderness Lungs:  Clear except for basilar rales HEART:  Regular rate rhythm, no murmurs, no rubs, no clicks Abd:  soft, positive bowel sounds, no organomegally, no  rebound, no guarding Ext:  2 plus pulses, left leg with 2+ edema, right leg with 1+ edema, no cyanosis, no clubbing Skin:  No rashes no nodules Neuro:  CN II through XII intact, motor grossly intact  ECG - NSR with nonspecific T wave abnormality  Assess/Plan: 1. PVC's - he is doing well and his symptoms are well controlled. I have recommended her reduce his dose of amiodarone to 100 mg daily on Saturday and Sunday.  2. Obesity - I have asked him to lose weight. 3. HTN - his blood pressure is well controlled. Will follow.  Mikle Bosworth.D.

## 2016-10-16 NOTE — Patient Instructions (Signed)
Medication Instructions:  Your physician has recommended you make the following change in your medication:  1) Amiodarone 1 tablet Monday-Friday and 1/2 tablet Saturday-Sunday.   Labwork: None Ordered   Testing/Procedures: None Ordered   Follow-Up:  Your physician wants you to follow-up in: 1 year with Dr. Lovena Le. You will receive a reminder letter in the mail two months in advance. If you don't receive a letter, please call our office to schedule the follow-up appointment.    Any Other Special Instructions Will Be Listed Below (If Applicable).     If you need a refill on your cardiac medications before your next appointment, please call your pharmacy.

## 2017-03-07 ENCOUNTER — Other Ambulatory Visit: Payer: Self-pay | Admitting: Internal Medicine

## 2017-03-07 MED ORDER — AMIODARONE HCL 200 MG PO TABS: ORAL_TABLET | ORAL | 2 refills | Status: DC

## 2017-11-07 DIAGNOSIS — G4733 Obstructive sleep apnea (adult) (pediatric): Secondary | ICD-10-CM | POA: Insufficient documentation

## 2017-11-07 DIAGNOSIS — F419 Anxiety disorder, unspecified: Secondary | ICD-10-CM | POA: Insufficient documentation

## 2017-11-07 DIAGNOSIS — E119 Type 2 diabetes mellitus without complications: Secondary | ICD-10-CM | POA: Insufficient documentation

## 2017-11-07 DIAGNOSIS — E213 Hyperparathyroidism, unspecified: Secondary | ICD-10-CM | POA: Insufficient documentation

## 2017-11-07 DIAGNOSIS — M810 Age-related osteoporosis without current pathological fracture: Secondary | ICD-10-CM | POA: Insufficient documentation

## 2017-11-07 DIAGNOSIS — I10 Essential (primary) hypertension: Secondary | ICD-10-CM | POA: Insufficient documentation

## 2017-11-23 ENCOUNTER — Encounter (INDEPENDENT_AMBULATORY_CARE_PROVIDER_SITE_OTHER): Payer: Self-pay

## 2017-11-23 ENCOUNTER — Ambulatory Visit: Payer: Medicare Other | Admitting: Internal Medicine

## 2017-11-23 ENCOUNTER — Encounter: Payer: Self-pay | Admitting: Internal Medicine

## 2017-11-23 VITALS — BP 138/72 | HR 82 | Ht 69.5 in | Wt 269.0 lb

## 2017-11-23 DIAGNOSIS — I4729 Other ventricular tachycardia: Secondary | ICD-10-CM

## 2017-11-23 DIAGNOSIS — I472 Ventricular tachycardia: Secondary | ICD-10-CM

## 2017-11-23 NOTE — Patient Instructions (Signed)

## 2017-11-23 NOTE — Progress Notes (Signed)
HPI [name] returns today for followup. 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 He underwent catheter ablation.  He has been on amiodarone.  He has not felt symptomatic PVC's since his last visit. He admits to being sedentary. He denies chest pain. His peripheral edema has improved. He admits to sodium indiscretion but is trying to do better. He admits to dietary indiscretion. When I saw him last we asked him to reduce his dose of amiodarone. No Known Allergies   Current Outpatient Medications  Medication Sig Dispense Refill  . amiodarone (PACERONE) 200 MG tablet Take 1 (200 mg) tablet Monday-Friday and 1/2 (100 mg) tablet Sat-Sun 90 tablet 2  . amLODipine-valsartan (EXFORGE) 10-320 MG per tablet Take 1 tablet by mouth daily before breakfast.     . aspirin 325 MG tablet Take 650 mg by mouth daily.    Marland Kitchen atorvastatin (LIPITOR) 20 MG tablet Take 0.5 tablets (10 mg total) by mouth at bedtime. 15 tablet 11  . b complex vitamins tablet Take 1 tablet by mouth daily.    . cholecalciferol (VITAMIN D) 1000 UNITS tablet Take 1,000 Units by mouth daily.    . furosemide (LASIX) 40 MG tablet Take 40 mg by mouth 2 (two) times daily. 8am and 2 pm    . insulin aspart (NOVOLOG) 100 UNIT/ML injection Inject into the skin 3 (three) times daily before meals. SLIDING SCALE    . insulin glargine (LANTUS) 100 UNIT/ML injection Inject 48 Units into the skin 2 (two) times daily.     . insulin lispro (HUMALOG) 100 UNIT/ML KiwkPen Inject 100 mLs into the skin see administration instructions. Sliding Scale    . latanoprost (XALATAN) 0.005 % ophthalmic solution Place 1 drop into both eyes at bedtime.     . metoprolol (LOPRESSOR) 50 MG tablet Take 0.5 tablets (25 mg total) by mouth 2 (two) times daily. 60 tablet 6  . Multiple Vitamin (MULTIVITAMIN WITH MINERALS) TABS tablet Take 1 tablet by mouth daily.    Marland Kitchen terazosin (HYTRIN) 10 MG capsule Take 20 mg by mouth at bedtime.      No current facility-administered medications for this visit.      Past Medical History:  Diagnosis Date  . Anxiety   . Diabetes mellitus without complication ([place])   . Hyperparathyroidism ([place])   . Hypertension   . Osteoporosis   . Palpitations   . Sleep apnea, obstructive     ROS:   All systems reviewed and negative except as noted in the HPI.   Past Surgical History:  Procedure Laterality Date  . BACK SURGERY     lower back  . HERNIA REPAIR     Lt ing hernia  . LEFT HEART CATHETERIZATION WITH CORONARY ANGIOGRAM N/A 07/30/2014   Procedure: LEFT HEART CATHETERIZATION WITH CORONARY ANGIOGRAM;  Surgeon: Larey Dresser, MD;  Location: [facility] CATH LAB;  Service: Cardiovascular;  Laterality: N/A;  . PARATHYROIDECTOMY  11/22/2012   Procedure: PARATHYROIDECTOMY;  Surgeon: Earnstine Regal, MD;  Location: [facility] ORS;  Service: General;  Laterality: N/A;  Parathyroidectomy  . V-TACH ABLATION N/A 07/31/2014   Procedure: V-TACH ABLATION;  Surgeon: Evans Lance, MD;  Location: [facility] CATH LAB;  Service: Cardiovascular;  Laterality: N/A;     Family History  Problem Relation Age of Onset  . Diabetes Mother   . Heart disease Mother   . Diabetes Father   . Heart disease Father  Social History   Socioeconomic History  . Marital status: Legally Separated    Spouse name: Not on file  . Number of children: Not on file  . Years of education: Not on file  . Highest education level: Not on file  Social Needs  . Financial resource strain: Not on file  . Food insecurity - worry: Not on file  . Food insecurity - inability: Not on file  . Transportation needs - medical: Not on file  . Transportation needs - non-medical: Not on file  Occupational History  . Not on file  Tobacco Use  . Smoking status: Former Smoker    Packs/day: 1.00    Years: 20.00    Pack years: 20.00    Types: Cigarettes    Last attempt to quit: 11/13/1972    Years since quitting: 45.0  . Smokeless tobacco: Never  Used  Substance and Sexual Activity  . Alcohol use: No  . Drug use: No  . Sexual activity: Not on file  Other Topics Concern  . Not on file  Social History Narrative  . Not on file     BP 138/72   Pulse 82   Ht 5' 9.5" (1.765 m)   Wt 269 lb (122 kg)   BMI 39.15 kg/m   Physical Exam:  Well appearing NAD HEENT: Unremarkable Neck:  No JVD, no thyromegally Lymphatics:  No adenopathy Back:  No CVA tenderness Lungs:  Clear with no wheezes HEART:  Regular rate rhythm, no murmurs, no rubs, no clicks Abd:  soft, positive bowel sounds, no organomegally, no rebound, no guarding Ext:  2 plus pulses, no edema, no cyanosis, no clubbing Skin:  No rashes no nodules Neuro:  CN II through XII intact, motor grossly intact  EKG - NSR with no ectopy  Assess/Plan: 1. PVC's - his symptoms are controlled on low dose amiodarone.  2. HTN - his blood pressure is reasonably well controlled. Will continue his current meds.  Mikle Bosworth.D.

## 2017-12-20 ENCOUNTER — Other Ambulatory Visit: Payer: Self-pay | Admitting: Internal Medicine

## 2018-12-01 ENCOUNTER — Other Ambulatory Visit: Payer: Self-pay | Admitting: Internal Medicine

## 2018-12-23 ENCOUNTER — Other Ambulatory Visit: Payer: Self-pay | Admitting: Internal Medicine

## 2019-08-26 ENCOUNTER — Other Ambulatory Visit: Payer: Self-pay | Admitting: Internal Medicine

## 2019-09-17 ENCOUNTER — Encounter: Payer: Self-pay | Admitting: Internal Medicine

## 2019-09-17 ENCOUNTER — Ambulatory Visit: Payer: Medicare Other | Admitting: Internal Medicine

## 2019-09-17 ENCOUNTER — Other Ambulatory Visit: Payer: Self-pay

## 2019-09-17 VITALS — BP 136/72 | HR 87 | Ht 69.5 in | Wt 271.2 lb

## 2019-09-17 DIAGNOSIS — I4729 Other ventricular tachycardia: Secondary | ICD-10-CM

## 2019-09-17 DIAGNOSIS — I472 Ventricular tachycardia: Secondary | ICD-10-CM | POA: Diagnosis not present

## 2019-09-17 MED ORDER — AMIODARONE HCL 200 MG PO TABS: 200.0000 mg | ORAL_TABLET | Freq: Every day | ORAL | 0 refills | Status: DC

## 2019-09-17 MED ORDER — AMIODARONE HCL 200 MG PO TABS: ORAL_TABLET | ORAL | 3 refills | Status: DC

## 2019-09-17 NOTE — Progress Notes (Signed)
HPI [name] Wiley returns today for followup. 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 He underwent catheter ablation. He has been on amiodarone. He has not felt symptomatic PVC's since his last visit. He admits to being sedentary. He denies chest pain. His peripheral edema remains, left more than right.  He admits to sodium indiscretion but is trying to do better. He admits to dietary indiscretion. When I saw him last we asked him to reduce his dose of amiodarone. He ran out of the amio about 4 days ago. No Known Allergies   Current Outpatient Medications  Medication Sig Dispense Refill   amiodarone (PACERONE) 200 MG tablet TAKE 1 TABLET BY MOUTH MONDAY-FRIDAY THEN 1/2 TABLET SAT-SUN. Please make overdue appt with Dr. Lovena Le before anymore refills. 2nd attempt 15 tablet 0   amLODipine-valsartan (EXFORGE) 10-320 MG per tablet Take 1 tablet by mouth daily before breakfast.      aspirin 325 MG tablet Take 650 mg by mouth daily.     atorvastatin (LIPITOR) 20 MG tablet Take 0.5 tablets (10 mg total) by mouth at bedtime. 15 tablet 11   b complex vitamins tablet Take 1 tablet by mouth daily.     calcitRIOL (ROCALTROL) 0.25 MCG capsule Take 1 capsule by mouth daily.     cholecalciferol (VITAMIN D) 1000 UNITS tablet Take 1,000 Units by mouth daily.     furosemide (LASIX) 40 MG tablet Take 40 mg by mouth 2 (two) times daily. 8am and 2 pm     insulin aspart (NOVOLOG) 100 UNIT/ML injection Inject into the skin 3 (three) times daily before meals. SLIDING SCALE     insulin glargine (LANTUS) 100 UNIT/ML injection Inject 48 Units into the skin 2 (two) times daily.      insulin lispro (HUMALOG) 100 UNIT/ML KiwkPen Inject 100 mLs into the skin see administration instructions. Sliding Scale     latanoprost (XALATAN) 0.005 % ophthalmic solution Place 1 drop into both eyes at bedtime.      metoprolol (LOPRESSOR) 50 MG tablet Take 0.5 tablets (25 mg  total) by mouth 2 (two) times daily. 60 tablet 6   Multiple Vitamin (MULTIVITAMIN WITH MINERALS) TABS tablet Take 1 tablet by mouth daily.     terazosin (HYTRIN) 10 MG capsule Take 20 mg by mouth at bedtime.     No current facility-administered medications for this visit.      Past Medical History:  Diagnosis Date   Anxiety    Diabetes mellitus without complication ([place])    Hyperparathyroidism ([place])    Hypertension    Osteoporosis    Palpitations    Sleep apnea, obstructive     ROS:   All systems reviewed and negative except as noted in the HPI.   Past Surgical History:  Procedure Laterality Date   BACK SURGERY     lower back   HERNIA REPAIR     Lt ing hernia   LEFT HEART CATHETERIZATION WITH CORONARY ANGIOGRAM N/A 07/30/2014   Procedure: LEFT HEART CATHETERIZATION WITH CORONARY ANGIOGRAM;  Surgeon: Larey Dresser, MD;  [facility] CATH LAB;  Service: Cardiovascular;  Laterality: N/A;   PARATHYROIDECTOMY  11/22/2012   Procedure: PARATHYROIDECTOMY;  Surgeon: Earnstine Regal, MD;  Location: [facility];  Service: General;  Laterality: N/A;  Parathyroidectomy   V-TACH ABLATION N/A 07/31/2014   Procedure: V-TACH ABLATION;  Surgeon: Evans Lance, MD;  Location: [facility] CATH LAB;  Service: Cardiovascular;  Laterality: N/A;  Family History  Problem Relation Age of Onset   Diabetes Mother    Heart disease Mother    Diabetes Father    Heart disease Father      Social History   Socioeconomic History   Marital status: Legally Separated    Spouse name: Not on file   Number of children: Not on file   Years of education: Not on file   Highest education level: Not on file  Occupational History   Not on file  Social Needs   Financial resource strain: Not on file   Food insecurity    Worry: Not on file    Inability: Not on file   Transportation needs    Medical: Not on file    Non-medical: Not on file  Tobacco Use   Smoking status: Former Smoker     Packs/day: 1.00    Years: 20.00    Pack years: 20.00    Types: Cigarettes    Quit date: 11/13/1972    Years since quitting: 46.8   Smokeless tobacco: Never Used  Substance and Sexual Activity   Alcohol use: No   Drug use: No   Sexual activity: Not on file  Lifestyle   Physical activity    Days per week: Not on file    Minutes per session: Not on file   Stress: Not on file  Relationships   Social connections    Talks on phone: Not on file    Gets together: Not on file    Attends religious service: Not on file    Active member of club or organization: Not on file    Attends meetings of clubs or organizations: Not on file    Relationship status: Not on file   Intimate partner violence    Fear of current or ex partner: Not on file    Emotionally abused: Not on file    Physically abused: Not on file    Forced sexual activity: Not on file  Other Topics Concern   Not on file  Social History Narrative   Not on file     BP 136/72    Pulse 87    Ht 5' 9.5" (1.765 m)    Wt 271 lb 3.2 oz (123 kg)    SpO2 94%    BMI 39.48 kg/m   Physical Exam:  obes appearing NAD HEENT: Unremarkable Neck:  No JVD, no thyromegally Lymphatics:  No adenopathy Back:  No CVA tenderness Lungs:  Clear with no wheezes HEART:  Regular rate rhythm, no murmurs, no rubs, no clicks Abd:  soft, positive bowel sounds, no organomegally, no rebound, no guarding Ext:  2 plus pulses, no edema, no cyanosis, no clubbing Skin:  No rashes no nodules Neuro:  CN II through XII intact, motor grossly intact  EKG - nsr   Assess/Plan: 1. PVC's/NSVT - he is doing well on low dose amiodarone. I will ask him to take one tab a day for 2 weeks then one tablet daily Mon-Fri and none on Sat. 2. Obesity - he is encouraged to lose weight.  3. Chronic diastolic heart failure - his symptoms remain class 2. He will maintain a low sodium diet.  [name] Wiley.D.

## 2019-09-17 NOTE — Addendum Note (Signed)
Addended by: Rose Phi on: 09/17/2019 03:31 PM   Modules accepted: Orders

## 2019-09-17 NOTE — Patient Instructions (Signed)
Medication Instructions:  Your physician recommends that you continue on your current medications as directed. Please refer to the Current Medication list given to you today.  Starting today 09/17/2019-Take amiodarone 200 mg one tablet by mouth every day until 10/03/2019.  Starting on 10/04/2019-You will take amiodarone 200 mg one tablet by mouth daily Monday through Friday.  Do NOT take on Saturday or Sunday.  Labwork: None ordered.  Testing/Procedures: None ordered.  Follow-Up: Your physician wants you to follow-up in: one year with Dr. Lovena Le.  You will receive a reminder letter in the mail two months in advance. If you don't receive a letter, please call our office to schedule the follow-up appointment.    Any Other Special Instructions Will Be Listed Below (If Applicable).  If you need a refill on your cardiac medications before your next appointment, please call your pharmacy.

## 2020-08-25 ENCOUNTER — Other Ambulatory Visit: Payer: Self-pay | Admitting: Internal Medicine

## 2020-09-20 ENCOUNTER — Other Ambulatory Visit: Payer: Self-pay | Admitting: Internal Medicine

## 2020-09-29 ENCOUNTER — Other Ambulatory Visit: Payer: Self-pay

## 2020-09-29 ENCOUNTER — Encounter: Payer: Self-pay | Admitting: Internal Medicine

## 2020-09-29 ENCOUNTER — Ambulatory Visit: Payer: Medicare Other | Admitting: Internal Medicine

## 2020-09-29 VITALS — BP 138/82 | HR 62 | Ht 69.5 in | Wt 272.0 lb

## 2020-09-29 DIAGNOSIS — I472 Ventricular tachycardia: Secondary | ICD-10-CM

## 2020-09-29 DIAGNOSIS — R002 Palpitations: Secondary | ICD-10-CM | POA: Diagnosis not present

## 2020-09-29 DIAGNOSIS — I5032 Chronic diastolic (congestive) heart failure: Secondary | ICD-10-CM | POA: Diagnosis not present

## 2020-09-29 DIAGNOSIS — I4729 Other ventricular tachycardia: Secondary | ICD-10-CM

## 2020-09-29 MED ORDER — VALSARTAN 320 MG PO TABS: 320.0000 mg | ORAL_TABLET | Freq: Every day | ORAL | 3 refills | Status: DC

## 2020-09-29 NOTE — Patient Instructions (Addendum)
Medication Instructions:  Your physician has recommended you make the following change in your medication:   1.  STOP taking amlodipine-valsartan (Exforge)  2.  START taking valsartan 320 mg- one tablet by mouth daily   Labwork: None ordered.  Testing/Procedures: None ordered.  Follow-Up: Your physician wants you to follow-up in: 6 months with Dr. Lovena Le.   You will receive a reminder letter in the mail two months in advance. If you don't receive a letter, please call our office to schedule the follow-up appointment.   Any Other Special Instructions Will Be Listed Below (If Applicable).  If you need a refill on your cardiac medications before your next appointment, please call your pharmacy.

## 2020-09-29 NOTE — Progress Notes (Signed)
HPI [name] Wiley returns today for followup. He is a pleasant 79 yo man with a h/o PVC"s, s/p ablation, PAF, amiodarone therapy, diastolic heart failure and chronic venous insufficiency. The patient denies cough, sob, or chest pain. He has been bothered by peripheral edema worse on the left. He denies dietary indiscretion.  No Known Allergies   Current Outpatient Medications  Medication Sig Dispense Refill   amiodarone (PACERONE) 200 MG tablet Take one tablet by mouth daily Monday through Friday.  Do NOT take on Saturday or Sunday. Please make yearly appt with Dr. Lovena Le for November for future refills. 2nd attempt 90 tablet 0   amLODipine-valsartan (EXFORGE) 10-320 MG per tablet Take 1 tablet by mouth daily before breakfast.      aspirin 325 MG tablet Take 650 mg by mouth daily.     atorvastatin (LIPITOR) 20 MG tablet Take 0.5 tablets (10 mg total) by mouth at bedtime. 15 tablet 11   b complex vitamins tablet Take 1 tablet by mouth daily.     calcitRIOL (ROCALTROL) 0.25 MCG capsule Take 1 capsule by mouth daily.     cholecalciferol (VITAMIN D) 1000 UNITS tablet Take 1,000 Units by mouth daily.     furosemide (LASIX) 40 MG tablet Take 40 mg by mouth 2 (two) times daily. 8am and 2 pm     insulin aspart (NOVOLOG) 100 UNIT/ML injection Inject into the skin 3 (three) times daily before meals. SLIDING SCALE     insulin glargine (LANTUS SOLOSTAR) 100 UNIT/ML Solostar Pen Inject 42 Units into the skin in the morning and at bedtime.     insulin lispro (HUMALOG) 100 UNIT/ML KiwkPen Inject 100 mLs into the skin see administration instructions. Sliding Scale     latanoprost (XALATAN) 0.005 % ophthalmic solution Place 1 drop into both eyes at bedtime.      metoprolol (LOPRESSOR) 50 MG tablet Take 0.5 tablets (25 mg total) by mouth 2 (two) times daily. 60 tablet 6   Multiple Vitamin (MULTIVITAMIN WITH MINERALS) TABS tablet Take 1 tablet by mouth daily.     tamsulosin (FLOMAX) 0.4 MG  CAPS capsule Take 1 capsule by mouth daily.     No current facility-administered medications for this visit.     Past Medical History:  Diagnosis Date   Anxiety    Diabetes mellitus without complication ([place])    Hyperparathyroidism ([place])    Hypertension    Osteoporosis    Palpitations    Sleep apnea, obstructive     ROS:   All systems reviewed and negative except as noted in the HPI.   Past Surgical History:  Procedure Laterality Date   BACK SURGERY     lower back   HERNIA REPAIR     Lt ing hernia   LEFT HEART CATHETERIZATION WITH CORONARY ANGIOGRAM N/A 07/30/2014   Procedure: LEFT HEART CATHETERIZATION WITH CORONARY ANGIOGRAM;  Surgeon: Larey Dresser, MD;  Location: [facility] CATH LAB;  Service: Cardiovascular;  Laterality: N/A;   PARATHYROIDECTOMY  11/22/2012   Procedure: PARATHYROIDECTOMY;  Surgeon: Earnstine Regal, MD;  Location: [facility];  Service: General;  Laterality: N/A;  Parathyroidectomy   V-TACH ABLATION N/A 07/31/2014   Procedure: V-TACH ABLATION;  Surgeon: Evans Lance, MD;  Location[facility] CATH LAB;  Service: Cardiovascular;  Laterality: N/A;     Family History  Problem Relation Age of Onset   Diabetes Mother    Heart disease Mother    Diabetes Father    Heart disease Father  Social History   Socioeconomic History   Marital status: Legally Separated    Spouse name: Not on file   Number of children: Not on file   Years of education: Not on file   Highest education level: Not on file  Occupational History   Not on file  Tobacco Use   Smoking status: Former Smoker    Packs/day: 1.00    Years: 20.00    Pack years: 20.00    Types: Cigarettes    Quit date: 11/13/1972    Years since quitting: 47.9   Smokeless tobacco: Never Used  Substance and Sexual Activity   Alcohol use: No   Drug use: No   Sexual activity: Not on file  Other Topics Concern   Not on file  Social History Narrative   Not on file   Social Determinants  of Health   Financial Resource Strain:    Difficulty of Paying Living Expenses: Not on file  Food Insecurity:    Worried About Charity fundraiser in the Last Year: Not on file   [employer] of Food in the Last Year: Not on file  Transportation Needs:    Lack of Transportation (Medical): Not on file   Lack of Transportation (Non-Medical): Not on file  Physical Activity:    Days of Exercise per Week: Not on file   Minutes of Exercise per Session: Not on file  Stress:    Feeling of Stress : Not on file  Social Connections:    Frequency of Communication with Friends and Family: Not on file   Frequency of Social Gatherings with Friends and Family: Not on file   Attends Religious Services: Not on file   Active Member of Clubs or Organizations: Not on file   Attends Archivist Meetings: Not on file   Marital Status: Not on file  Intimate Partner Violence:    Fear of Current or Ex-Partner: Not on file   Emotionally Abused: Not on file   Physically Abused: Not on file   Sexually Abused: Not on file     BP 138/82    Pulse 62    Ht 5' 9.5" (1.765 m)    Wt 272 lb (123.4 kg)    SpO2 99%    BMI 39.59 kg/m   Physical Exam:  Well appearing NAD HEENT: Unremarkable Neck:  No JVD, no thyromegally Lymphatics:  No adenopathy Back:  No CVA tenderness Lungs:  Clear with no wheezes HEART:  Regular rate rhythm, no murmurs, no rubs, no clicks Abd:  soft, positive bowel sounds, no organomegally, no rebound, no guarding Ext:  2 plus pulses, no edema, no cyanosis, no clubbing Skin:  No rashes no nodules Neuro:  CN II through XII intact, motor grossly intact  EKG - nsr  Assess/Plan: 1. Venous insufficiency - I asked him to keep his leg elevated and to stop the amlodipine. A low sodium diet is encouraged. 2. HTN - his bp is minimally elevated. He may require an adjustment in his meds when he stops the amlodipine. 3. PVC's - he is well controlled on low dose  amiodarone. 4. Dyslipidemia - he will continue his statin therapy.  Carleene Overlie Dorita Rowlands,MD

## 2021-01-24 ENCOUNTER — Other Ambulatory Visit: Payer: Self-pay | Admitting: Internal Medicine

## 2021-02-03 ENCOUNTER — Other Ambulatory Visit: Payer: Self-pay | Admitting: Internal Medicine

## 2021-02-03 NOTE — Telephone Encounter (Signed)
Outpatient Medication Detail   Disp Refills Start End   amiodarone (PACERONE) 200 MG tablet 90 tablet 0 01/25/2021    Sig: Take one tablet by mouth daily Monday through Friday.  Do NOT take on Saturday or Sunday.   Sent to pharmacy as: amiodarone (PACERONE) 200 MG tablet   E-Prescribing Status: Receipt confirmed by pharmacy (01/25/2021  1:02 PM EDT)     Pharmacy  [id number] - [place], [place] [address]

## 2021-04-26 ENCOUNTER — Other Ambulatory Visit: Payer: Self-pay | Admitting: Internal Medicine

## 2021-09-16 ENCOUNTER — Other Ambulatory Visit: Payer: Self-pay | Admitting: Internal Medicine

## 2024-06-13 DEATH — deceased
